# Patient Record
Sex: Female | Born: 1975 | Race: Black or African American | Hispanic: No | Marital: Married | Smoking: Never smoker
Health system: Southern US, Community
[De-identification: ages and names within clinical notes are randomized; demographics above are authoritative.]

## PROBLEM LIST (undated history)

## (undated) DIAGNOSIS — D219 Benign neoplasm of connective and other soft tissue, unspecified: Secondary | ICD-10-CM

## (undated) DIAGNOSIS — IMO0002 Reserved for concepts with insufficient information to code with codable children: Secondary | ICD-10-CM

## (undated) HISTORY — DX: Reserved for concepts with insufficient information to code with codable children: IMO0002

## (undated) HISTORY — DX: Benign neoplasm of connective and other soft tissue, unspecified: D21.9

## (undated) HISTORY — PX: CERVICAL CERCLAGE: SHX1329

---

## 2000-02-04 ENCOUNTER — Inpatient Hospital Stay (HOSPITAL_COMMUNITY): Admission: AD | Admit: 2000-02-04 | Discharge: 2000-02-07 | Payer: Self-pay | Admitting: *Deleted

## 2000-02-11 ENCOUNTER — Encounter (HOSPITAL_COMMUNITY): Admission: RE | Admit: 2000-02-11 | Discharge: 2000-05-11 | Payer: Self-pay | Admitting: *Deleted

## 2000-02-18 ENCOUNTER — Inpatient Hospital Stay (HOSPITAL_COMMUNITY): Admission: AD | Admit: 2000-02-18 | Discharge: 2000-02-25 | Payer: Self-pay | Admitting: *Deleted

## 2000-03-31 ENCOUNTER — Encounter: Payer: Self-pay | Admitting: *Deleted

## 2000-05-05 ENCOUNTER — Encounter: Payer: Self-pay | Admitting: Obstetrics & Gynecology

## 2000-05-12 ENCOUNTER — Encounter: Payer: Self-pay | Admitting: *Deleted

## 2000-05-12 ENCOUNTER — Encounter (HOSPITAL_COMMUNITY): Admission: RE | Admit: 2000-05-12 | Discharge: 2000-06-17 | Payer: Self-pay | Admitting: *Deleted

## 2000-06-09 ENCOUNTER — Encounter: Payer: Self-pay | Admitting: *Deleted

## 2000-06-16 ENCOUNTER — Encounter (INDEPENDENT_AMBULATORY_CARE_PROVIDER_SITE_OTHER): Payer: Self-pay

## 2000-06-16 ENCOUNTER — Inpatient Hospital Stay (HOSPITAL_COMMUNITY): Admission: AD | Admit: 2000-06-16 | Discharge: 2000-06-19 | Payer: Self-pay | Admitting: *Deleted

## 2000-06-23 ENCOUNTER — Inpatient Hospital Stay (HOSPITAL_COMMUNITY): Admission: AD | Admit: 2000-06-23 | Discharge: 2000-06-23 | Payer: Self-pay | Admitting: *Deleted

## 2000-07-28 ENCOUNTER — Encounter (INDEPENDENT_AMBULATORY_CARE_PROVIDER_SITE_OTHER): Payer: Self-pay

## 2000-07-28 ENCOUNTER — Inpatient Hospital Stay (HOSPITAL_COMMUNITY): Admission: AD | Admit: 2000-07-28 | Discharge: 2000-07-28 | Payer: Self-pay | Admitting: *Deleted

## 2004-09-18 ENCOUNTER — Ambulatory Visit: Payer: Self-pay | Admitting: *Deleted

## 2004-09-19 ENCOUNTER — Observation Stay (HOSPITAL_COMMUNITY): Admission: AD | Admit: 2004-09-19 | Discharge: 2004-09-20 | Payer: Self-pay | Admitting: *Deleted

## 2004-09-19 ENCOUNTER — Ambulatory Visit: Payer: Self-pay | Admitting: *Deleted

## 2004-09-25 ENCOUNTER — Inpatient Hospital Stay (HOSPITAL_COMMUNITY): Admission: AD | Admit: 2004-09-25 | Discharge: 2004-09-27 | Payer: Self-pay | Admitting: *Deleted

## 2004-10-10 ENCOUNTER — Ambulatory Visit: Payer: Self-pay | Admitting: Family Medicine

## 2004-10-24 ENCOUNTER — Ambulatory Visit: Payer: Self-pay | Admitting: Family Medicine

## 2004-10-25 ENCOUNTER — Ambulatory Visit (HOSPITAL_COMMUNITY): Admission: RE | Admit: 2004-10-25 | Discharge: 2004-10-25 | Payer: Self-pay | Admitting: *Deleted

## 2004-11-07 ENCOUNTER — Ambulatory Visit: Payer: Self-pay | Admitting: Family Medicine

## 2004-11-12 ENCOUNTER — Ambulatory Visit: Payer: Self-pay | Admitting: *Deleted

## 2004-11-14 ENCOUNTER — Ambulatory Visit: Payer: Self-pay | Admitting: Family Medicine

## 2004-11-27 ENCOUNTER — Ambulatory Visit: Payer: Self-pay | Admitting: *Deleted

## 2004-12-04 ENCOUNTER — Ambulatory Visit: Payer: Self-pay | Admitting: Family Medicine

## 2004-12-11 ENCOUNTER — Encounter: Admission: RE | Admit: 2004-12-11 | Discharge: 2004-12-11 | Payer: Self-pay | Admitting: Nephrology

## 2004-12-19 ENCOUNTER — Ambulatory Visit: Payer: Self-pay | Admitting: Family Medicine

## 2004-12-26 ENCOUNTER — Ambulatory Visit: Payer: Self-pay | Admitting: *Deleted

## 2005-01-02 ENCOUNTER — Ambulatory Visit: Payer: Self-pay | Admitting: Family Medicine

## 2005-01-09 ENCOUNTER — Ambulatory Visit: Payer: Self-pay | Admitting: Family Medicine

## 2005-01-23 ENCOUNTER — Ambulatory Visit: Payer: Self-pay | Admitting: Family Medicine

## 2005-02-06 ENCOUNTER — Ambulatory Visit: Payer: Self-pay | Admitting: Family Medicine

## 2005-02-20 ENCOUNTER — Ambulatory Visit: Payer: Self-pay | Admitting: Family Medicine

## 2005-02-27 ENCOUNTER — Ambulatory Visit: Payer: Self-pay | Admitting: Family Medicine

## 2005-03-06 ENCOUNTER — Ambulatory Visit: Payer: Self-pay | Admitting: Family Medicine

## 2005-03-13 ENCOUNTER — Ambulatory Visit: Payer: Self-pay | Admitting: Family Medicine

## 2005-03-15 ENCOUNTER — Encounter (INDEPENDENT_AMBULATORY_CARE_PROVIDER_SITE_OTHER): Payer: Self-pay | Admitting: Specialist

## 2005-03-15 ENCOUNTER — Inpatient Hospital Stay (HOSPITAL_COMMUNITY): Admission: AD | Admit: 2005-03-15 | Discharge: 2005-03-18 | Payer: Self-pay | Admitting: Family Medicine

## 2005-03-25 ENCOUNTER — Ambulatory Visit: Payer: Self-pay | Admitting: *Deleted

## 2005-04-01 ENCOUNTER — Ambulatory Visit: Payer: Self-pay | Admitting: Obstetrics and Gynecology

## 2009-09-12 ENCOUNTER — Ambulatory Visit: Payer: Self-pay | Admitting: Obstetrics and Gynecology

## 2010-09-08 ENCOUNTER — Encounter: Payer: Self-pay | Admitting: Nephrology

## 2010-09-18 HISTORY — PX: DILATION AND CURETTAGE OF UTERUS: SHX78

## 2011-01-03 NOTE — Discharge Summary (Signed)
NAMEIOLA, Karina Bray             ACCOUNT NO.:  [id number]   MEDICAL RECORD NO.:  [id number]          PATIENT TYPE:  INP   LOCATION:  9317                          [facility]ILITY[facility]  [facility]   PHYSICIAN:  Jon Gills, M.D.  DATE OF BIRTH:  11/23/1975   DATE OF ADMISSION:  09/25/2004  DATE OF DISCHARGE:  09/27/2004                                 DISCHARGE SUMMARY   ATTENDING PHYSICIAN:  Conni Elliot, M.D.   ADMISSION DIAGNOSES:  50.  A 35 year old G3, P1-1-1-1 at 15 weeks.  2.  History of cervical incompetence with a 21 week previable delivery and a      subsequent full-term delivery with cerclage.  3.  History of group beta Streptococcus with the last pregnancy.   DISCHARGE DIAGNOSES:  1.  A 15-week intrauterine pregnancy,  2.  Postoperative day #1, status post cerclage for cervical weakness.   DISCHARGE MEDICATIONS:  Prenatal vitamin one p.o. daily.   ADMISSION HISTORY:  Ms. Storr was admitted to the [facility] for IV  antibiotics and p.o. Motrin prior to receiving a cerclage.  The cerclage was  placed on hospital day #2.  Please see the operative note.  Her  postoperative course was uncomplicated.  She continued with Unasyn for a  complete day following her procedure.  She had no contractions, no loss of  fluid, and no vaginal bleeding.  She had good fetal movement.   CONDITION ON DISCHARGE:  The patient was discharged to home in stable  condition.  Her cervix was closed and thick with the cerclage in place.   INSTRUCTIONS GIVEN TO PATIENT:  The patient was told to [facility] and return with bleeding, contractions, or loss of  fluid.      LC/MEDQ  D:  09/27/2004  T:  09/28/2004  Job:  [id number]   cc:   [facility]

## 2011-01-03 NOTE — Op Note (Signed)
[facility]  Patient:    Karina Bray, Karina Bray                    MRN: [id number] Proc. Date: 02/05/00 Adm. Date:  [id number] Attending:  Michaelle Copas                           Operative Report  PREOPERATIVE DIAGNOSIS:       Decreased cervical resistance.  POSTOPERATIVE DIAGNOSIS:      Decreased cervical resistance.  OPERATION:                    Modified McDonald cerclage.  SURGEON:                      Conni Elliot, M.D.  ASSISTANT:  ANESTHESIA:                   Spinal.  ESTIMATED BLOOD LOSS:  DESCRIPTION OF PROCEDURE:     After placing the patient in the dorsal lithotomy  position, the perineum and vagina were prepped with multiple layers of Betadine  solution.  A weighted speculum was placed in the posterior vagina.  Anterior cervix was grasped with a spongestick.  A cervical cerclage using #4 silk double stranded was placed starting at 12 oclock in a counterclockwise fashion.  Estimated blood loss was less than 10 cc.  Sponge, needle, and instrument counts were correct. DD:  02/05/00 TD:  02/06/00 Job: [id number]

## 2011-01-03 NOTE — Discharge Summary (Signed)
[facility]  Patient:    Karina Bray, Karina Bray                    MRN: [id number] Adm. Date:  [id number] Disc. Date: [id number] Attending:  Michaelle Copas Dictator:   Creed Copper. Cornelius Moras, M.D.                           Discharge Summary  DISCHARGE DIAGNOSES:          1. Intrauterine pregnancy at 20-5/7 weeks.                               2. Status post cerclage placement.                               3. Group B strep positive.  DISCHARGE MEDICATIONS:        Unasyn 3 g IV q.6h. until Tuesday, February 11, 2000.  ACTIVITY:                     The patient is instructed to continue bed rest, to get up to eat and bathroom only.  Instructed not to have any sexual contact or anything that may make her have an orgasm.  FOLLOW-UP:                    In Dr. Gavin Potters clinic Tuesday, February 11, 2000, at 3 p.m.  HISTORY OF PRESENT ILLNESS:   The patient reports with contractions and cervical changes over the past few checks per Dr. Gavin Potters, with funneling noted by her primary M.D.  Referred to Dr. Gavin Potters for evaluation and placement of cerclage.  Admitted for placement of cerclage.  ADMISSION PHYSICAL EXAMINATION:         Temperature 98, blood pressure 121/70, pulse 96. Fetal heart tones ______ .  Negative accelerations, negative decelerations. On tocolysis there was uterine irritability with one contraction.  On cervical exam, cervix was 1 cm dilated, long, and high.  HOSPITAL COURSE: #1 - PRETERM DILATION OF CERVIX:  The patient was treated with antibiotics of Unasyn overnight prior to cerclage placement.  Placed on bed rest and Motrin. The patient tolerated placement of cerclage well.  Please see Dr. Gavin Potters operative note for details of this.  At the time of admission, cultures for group B strep and GC and chlamydia were taken.  During the course of hospitalization, group B strep was identified.  The patient was maintained on Unasyn pending the results of  this test and is sent home on Unasyn, given the results of these tests.  The patient had no uterine irritability or contractions although, on the morning of discharge, did report some increased pressure in her pelvis after standing in the shower for a prolonged period of time.  She agrees to limit her standing. DD:  02/07/00 TD:  02/10/00 Job: [id number]

## 2011-01-03 NOTE — Discharge Summary (Signed)
[facility]  Patient:    Karina Bray, Karina Bray                    MRN: [id number] Adm. Date:  [id number] Disc. Date: 06/19/00 Attending:  Michaelle Copas Dictator:   Jamey Reas, M.D.                           Discharge Summary  DATE OF BIRTH:                Dec 06, 1975  ADMITTING DIAGNOSES:          Prodromal labor, breech position, preeclampsia.  DISCHARGE DIAGNOSES:          Prodromal labor, breech position, preeclampsia.  CONSULTS:                     None.  PROCEDURE:                    Primary low transverse cesarean section via Pfannenstiel on June 16, 2000.  HOSPITAL COURSE:              Patient came in for a prenatal visit.  She is a 35 year old G3, P1-1-1-1.  Postoperative day #3 status post low transverse cesarean section for breech presentation as well as prodromal labor and preeclampsia.  Patient was maintained on magnesium sulfate post cesarean section x 48 hours.  She had routine postoperative course.  The magnesium was discontinued on hospital day #2.  She remained stable throughout the remainder of her hospital course.  She had a viable female infant weighing 7 pounds 7 ounces who remained stable throughout hospital course.  Postpartum hemoglobin and hematocrit are 10.9 and 31.7 done on June 16, 2000.  Patient is bottle feeding.  She will be discharged to home.  She will follow up with Dr. Welton Flakes, her primary M.D. in [place] in six weeks for a postpartum check.  She will follow up with Dr. Corky Sox in maternity admissions on June 23, 2000 at 10:30 for recheck of her blood pressures.  DISCHARGE INSTRUCTIONS:       Routine postoperative.  DISCHARGE MEDICATIONS:        1. Percocet 5/500 mg one to two p.o. q.4-6h.                                  p.r.n. severe pain #20.                               2. Motrin 600 mg one p.o. t.i.d. p.r.n. pain.                               3. Prenatal vitamins one  p.o. q.d. x 6 weeks.  DISPOSITION:                  Patient is discharged to home in stable condition.DD:  06/19/00 TD:  06/19/00 Job: [id number]

## 2011-01-03 NOTE — Op Note (Signed)
[facility]  Patient:    Karina Bray, Karina Bray                    MRN: [id number] Proc. Date: 06/16/00 Adm. Date:  [id number] Attending:  Michaelle Copas Dictator:   Jamey Reas, M.D.                           Operative Report  DATE OF BIRTH:                10/02/1975  PREOPERATIVE DIAGNOSES:       Breech, prodromal labor, preeclampsia, fetal tachycardia.  POSTOPERATIVE DIAGNOSES:      Breech, prodromal labor, preeclampsia, fetal tachycardia.  PROCEDURE:                    Primary low transverse cesarean section via Pfannenstiel.  SURGEON:                      Conni Elliot, M.D.  FIRST ASSIST:                 Jamey Reas, M.D.  ANESTHESIA:                   Spinal.  FINDINGS:                     Viable female.  Apgars 6 at one minute, 9 at five minutes weighing 7 pounds 7 ounces.  FLUIDS:                       Lactated Ringers 1000 cc.  ESTIMATED BLOOD LOSS:         500 cc.  URINE OUTPUT:                 50 cc and concentrated.  COMPLICATIONS:                None.  CONDITION:                    Stable.  PROCEDURE:                    The patient was taken to the operating room where spinal anesthesia was introduced.  She was then prepped and draped in a normal sterile fashion in the dorsal supine position with a leftward tilt.  A Pfannenstiel incision was then made with a scalpel and carried through to the underlying layer of fascia with a knife.  The fascia was incised in the midline and the excision extended laterally with Mayo scissors.  Superior aspect of the fascial incision was then grasped with Kocher clamps and elevated.  Underlying rectus muscles dissected off bluntly.  Attention was then turned to the inferior aspect of this incision which in a similar fashion was grasped, tented up with Kocher clamps, and the rectus muscle dissected off bluntly.  The rectus muscles were then separated in the  midline, the peritoneum identified, tented up, and entered sharply with Metzenbaum scissors.  The peritoneal incision was then extended superiorly and inferiorly with good visualization of the bladder.  Bladder blade was then reinserted and the vesicouterine peritoneum identified, grasped with pickups, and entered sharply with Metzenbaum scissors.  This incision was then extended laterally. A bladder flap created digitally.  The bladder blade was then reinserted and the lower uterine  segment incised in a transverse fashion with the scalpel. The uterine incision was then extended laterally by stretching.  The bladder blade was removed and the infants left lower extremity delivered.  The right lower extremity was then delivered.  The remainder of the body was delivered in normal fashion.  The nose and mouth were suctioned with the bulb suction, the cord clamped and cut.  The infant was handed off to the awaiting pediatricians.  Cord gasses were sent.  Cord pH was 7.18.  The placenta was then removed manually, the uterus exteriorized and cleared of all clots and debris.  The uterine incision was repaired with 1-0 Chromic in a running locked fashion.  A second layer of the same suture was then used to obtain excellent hemostasis.  The bladder flap was repaired with 2-0 Chromic ______ needle in a running stitch and the uterus returned to the abdomen.  The gutters were cleared of all clots and the peritoneum closed with 2-0 Chromic suture.  The fascia was reapproximated with 0 Vicryl in a running fashion. The skin was closed with staples.  Patient tolerated the procedure well. Sponge, lap, and needle counts were correct x 2.  Unasyn 3 g was given IV prior to the procedure.  The patient was taken to the recovery room in stable condition.  She will be transferred to the AICU where magnesium will be started for her preeclampsia. DD:  06/16/00 TD:  06/16/00 Job: [id number]

## 2011-01-03 NOTE — Op Note (Signed)
NAMEETHLEEN, LORMAND             ACCOUNT NO.:  [id number]   MEDICAL RECORD NO.:  [id number]          PATIENT TYPE:  [facility]   LOCATION:  [facility]                            [facility]ACILITY[facility]   PHYSICIAN:  Tanya S. Shawnie Pons, M.D.   DATE OF BIRTH:  Jun 04, 1976   DATE OF PROCEDURE:  03/15/2005  DATE OF DISCHARGE:  03/18/2005                                 OPERATIVE REPORT   PREOPERATIVE DIAGNOSES:  1.  Intrauterine pregnancy at 39-plus weeks.  2.  Previous cesarean section.  3.  Active labor.  4.  History of cerclage in this pregnancy, now removed.  5.  Proteinuria.   POSTOPERATIVE DIAGNOSES:  1.  Intrauterine pregnancy at 39-plus weeks.  2.  Previous cesarean section.  3.  Active labor.  4.  History of cerclage in this pregnancy, now removed.  5.  Proteinuria.  6.  Breech presentation.   PROCEDURE:  Repeat low transverse C-section.   SURGEON:  Shelbie Proctor. Shawnie Pons, M.D.   ASSISTANT:  Rolm Gala, M.D.   ANESTHESIA:  Spinal and local.   SPECIMENS:  Placenta to pathology.   ESTIMATED BLOOD LOSS:  1,000 cc.   FINDINGS:  Viable female infant in a breech presentation.  Meconium-stained  fluid.  Apgars 3 and 7.  pH 7.28.   REASON FOR PROCEDURE:  The patient is a 35 year old gravida 3 para 1-1-0-1  who had a history of a second trimester loss followed by a pregnancy with a  cerclage that went to term who is pregnant this time with a cerclage.  She  is followed in the high risk clinic and had a previous C-section.  The  patient desired a repeat C-section plus BTL.  However, she changed her mind  about BTL prior to the procedure.   PROCEDURE:  The patient was taken to the O.R., where spinal analgesia was  administered.  She was then prepped and draped in the usual sterile fashion,  and a Foley catheter was placed in the bladder.  When anesthesia was found  to be adequate, a knife was used to make a Pfannenstiel incision through an  old scar.  This was carried down to the underlying fascia,  which was incised  in the midline.  Two Kocher clamps were then used to elevate the superior  edge of the fascia off the underlying rectus, and this was taken down  sharply with Mayo scissors.  Peritoneal cavity was entered and then the  incision extended by the surgeon, pulling on either side of the incision.  Bladder blade was placed inside the cavity, and a low transverse incision  was made on the uterus.  This was carried down to the underlying amniotic  cavity, where thick meconium-stained fluid was noted.  It was possible that  the patient had been ruptured since three days prior.  However, this had not  been completely confirmed, but she had been complaining of leakage of fluid.  Infant was also noted to be a breech presentation.  The infant was moving  during the incision.  There was some difficulty in getting the breech out,  as the  infant's legs kept contracting about the surgeon's hand.  Finally,  the legs were delivered and the rest of the body delivered, arms and head  atraumatically.  The infant was noted to have poor tone and grasp x 2.  DeLee suction was then used on the abdomen and the cord clamped x 2 and  infant taken to awaiting pediatrics.  Cord pH and cord blood were obtained.  The placenta was delivered, the uterine cavity cleaned out with dry lap pad.  The uterus was closed with a 0 Vicryl suture in a locked-running fashion,  and a second imbricating layer was then used to complete hemostasis.  The  tubes and ovaries were felt and found to be normal.  However, omentum and  bowel were coming up through the incision, and the patient was very  uncomfortable with any type of manipulation of this.  At that point, the  fascia was closed with 0 Vicryl suture in a running fashion.  The  subcutaneous tissue was irrigated and any bleeders cauterized, and the skin  closed using clips.  The incision was infiltrated with 10 cc of 0.25%  Marcaine.  All instrument, needle, and lap  counts were correct x 2.  The  patient was awakened and taken to the recovery room in stable condition.       TSP/MEDQ  D:  03/15/2005  T:  03/16/2005  Job:  [id number]

## 2011-01-03 NOTE — Op Note (Signed)
Karina Bray, Karina Bray             ACCOUNT NO.:  [id number]   MEDICAL RECORD NO.:  [id number]          PATIENT TYPE:  INP   LOCATION:  9317                          [facility]   PHYSICIAN:  Conni Elliot, M.D.DATE OF BIRTH:  1976/02/05   DATE OF PROCEDURE:  09/26/2004  DATE OF DISCHARGE:  09/27/2004                                 OPERATIVE REPORT   PREOPERATIVE DIAGNOSIS:  Decreased cervical resistance at 15 weeks'  pregnancy.   POSTOPERATIVE DIAGNOSIS:  Decreased cervical resistance at 15 weeks'  pregnancy.   OPERATION:  Cervical cerclage.   OPERATOR:  Conni Elliot, M.D.   ANESTHESIA:  Spinal.   OPERATIVE PROCEDURE:  After placing the patient under spinal anesthetic, the  patient was in the supine then dorsal lithotomy position.  Perineum and  vagina were prepped with Betadine solution.  Foley catheter was placed to  straight drainage.  A weighted speculum was placed in the posterior vagina.  Clindamycin douche was placed.  The anterior cervix was grasped with a  sponge stick.  A cervical cerclage was placed using #4 silk double stranded  starting at 12 o'clock in a counterclockwise fashion.  Knot was tied at 12  o'clock.  Clindamycin douche was reapplied.  Needle and sponge count were  correct.      ASG/MEDQ  D:  10/17/2004  T:  10/17/2004  Job:  [id number]

## 2011-01-03 NOTE — Discharge Summary (Signed)
[facility]  Patient:    Karina Bray, Karina Bray                    MRN: [id number] Adm. Date:  [id number] Disc. Date: 02/25/00 Attending:  Michaelle Copas                           Discharge Summary  DATE OF BIRTH:                April 08, 1976  DISCHARGE DIAGNOSIS:          Preterm labor.  PROCEDURES:                   Pelvic ultrasound.  PRESENTING HISTORY:           This is a 35 year old G3, P0-1-1-0 [facility] 22 weeks 3 days of an intrauterine pregnancy who was seen for a prenatal visit and was found to have uterine contractions on ______.  Patients obstetrical history was significant for a cerclage with this pregnancy due to a spontaneous abortion at 21 weeks in the year 2000 and another spontaneous abortion in 1998.  On presentation patient was also found to be group B strep positive and have a hemoglobin and hematocrit of 11.5 and 33.3 respectively.  Therefore, patients obstetrical history and uterine contractions had been picked up on the monitor.  Patient was admitted at the antenatal unit for preterm labor and was begun on IV Unasyn and IV fluids plus monitoring.  Patient did well over the course of her stay without significant uterine activity and was discharged to home on Procardia 3 q.d. and clindamycin douches plus amoxicillin p.o. q.d.  DISPOSITION:                  Patient was discharged home in good condition.  FOLLOW-UP:                    Patient was to follow up on March 03, 2000 at the high risk clinic at 3 p.m. DD:  05/24/00 TD:  05/25/00 Job: [id number]

## 2011-01-03 NOTE — Discharge Summary (Signed)
NAMEALDEA, Karina Bray             ACCOUNT NO.:  [id number]   MEDICAL RECORD NO.:  [id number]          PATIENT TYPE:  INP   LOCATION:  [facility]                            FACILITY:  [facility]   PHYSICIAN:  Tanya S. Shawnie Pons, M.D.   DATE OF BIRTH:  15-Nov-1975   DATE OF ADMISSION:  03/13/2005  DATE OF DISCHARGE:  03/18/2005                                 DISCHARGE SUMMARY   ADMITTING DIAGNOSIS:  Previous cesarean section.   DISCHARGE DIAGNOSIS:  Repeat cesarean section.   CONSULTS:  None.   PERTINENT LABORATORIES:  Hemoglobin 10.3, hematocrit 30.4, white blood count  8.8.  RPR nonreactive.  Rubella immune.  GBS unknown.   PROCEDURES:  Lower transverse repeat cesarean section.   HOSPITAL COURSE:  This is a 35 year old G3, P1-2-0-2 who presented to the  [facility] laboring and with questionable ruptured membranes and  underwent a lower transverse cesarean section at 39 and 5 days.  Baby was in  the NICU and doing well now.  Patient tolerated procedure well and was  afebrile the day of discharge.  Baby's Apgars were 3 and 7.  Estimated blood  loss 700 mL.  Mom is bottle feeding, to get IUD.  Blood type B+.  Rubella  immune.  Mom was discharged home on day number 3 and baby remained in the  NICU.   DISPOSITION:  Patient home in good condition.   DIET:  Regular.   ACTIVITY:  Nothing per vagina for six weeks.   __________ Percocet 5/325 p.o. q.4h. p.r.n. pain, ibuprofen __________ p.o.  q.6h. p.r.n. cramps, prenatal vitamins.   DISCHARGE INSTRUCTIONS:  Per pink sheet.  Patient is to follow up at [facility]  __________ five days for suture removal and she will follow up in four weeks  for a postpartum check.      Krist   KS/MEDQ  D:  03/18/2005  T:  03/18/2005  Job:  [id number]

## 2011-01-03 NOTE — Op Note (Signed)
[name], [name]             ACCOUNT NO.:  [id number]   MEDICAL RECORD NO.:  [id number]          PATIENT TYPE:  INP   LOCATION:  9317                          [facility]ILITY:  [facility]   PHYSICIAN:  Conni Elliot, M.D.DATE OF BIRTH:  1976-07-13   DATE OF PROCEDURE:  09/26/2004  DATE OF DISCHARGE:                                 OPERATIVE REPORT   PREOPERATIVE DIAGNOSIS:  Weak cervix at 15 weeks pregnancy.   POSTOPERATIVE DIAGNOSIS:  Weak cervix at 15 weeks pregnancy.   PROCEDURE:  [employer] cerclage.   SURGEON:  Conni Elliot, M.D.   ANESTHESIA:  Spinal.   DESCRIPTION OF PROCEDURE:  After placing the patient under spinal anesthesia  with the patient supine in the dorsal lithotomy position, the perineum and  vagina were prepped and draped with Betadine solution.  A weighted speculum  was placed in the posterior vagina and a Foley catheter was placed to  straight drainage.  The anterior cervix was grasped with a spongestick and  cervical cerclage was placed starting at 12 o'clock using 4-0 double  stranded silk.  The cerclage was placed in a counter clockwise fashion, tied  at 12 o'clock.  Estimated blood loss was less than 50 mL.  Needle, sponge,  and instrument counts correct.      ASG/MEDQ  D:  09/26/2004  T:  09/26/2004  Job:  [id number]

## 2011-11-24 ENCOUNTER — Encounter: Payer: Self-pay | Admitting: Obstetrics & Gynecology

## 2011-11-25 DIAGNOSIS — Z3490 Encounter for supervision of normal pregnancy, unspecified, unspecified trimester: Secondary | ICD-10-CM

## 2011-11-25 DIAGNOSIS — O0990 Supervision of high risk pregnancy, unspecified, unspecified trimester: Secondary | ICD-10-CM | POA: Insufficient documentation

## 2011-11-28 LAB — OB RESULTS CONSOLE PLATELET COUNT: Platelets: 194 10*3/uL

## 2011-11-28 LAB — OB RESULTS CONSOLE HIV ANTIBODY (ROUTINE TESTING): HIV: NONREACTIVE

## 2011-11-28 LAB — CULTURE, OB URINE

## 2011-11-28 LAB — OB RESULTS CONSOLE HEPATITIS B SURFACE ANTIGEN: Hepatitis B Surface Ag: NEGATIVE

## 2011-11-28 LAB — CYTOLOGY - PAP: Pap: NEGATIVE

## 2011-11-28 LAB — OB RESULTS CONSOLE RUBELLA ANTIBODY, IGM: Rubella: IMMUNE

## 2011-11-28 LAB — OB RESULTS CONSOLE ABO/RH: RH Type: POSITIVE

## 2011-11-28 LAB — OB RESULTS CONSOLE GC/CHLAMYDIA: Chlamydia: NEGATIVE

## 2011-12-03 ENCOUNTER — Encounter: Payer: Self-pay | Admitting: Advanced Practice Midwife

## 2011-12-18 DIAGNOSIS — N883 Incompetence of cervix uteri: Secondary | ICD-10-CM | POA: Insufficient documentation

## 2011-12-25 ENCOUNTER — Ambulatory Visit (INDEPENDENT_AMBULATORY_CARE_PROVIDER_SITE_OTHER): Payer: BC Managed Care – PPO | Admitting: Obstetrics and Gynecology

## 2011-12-25 ENCOUNTER — Encounter: Payer: Self-pay | Admitting: Obstetrics and Gynecology

## 2011-12-25 VITALS — BP 125/83 | Temp 98.6°F | Wt 202.4 lb

## 2011-12-25 DIAGNOSIS — O3430 Maternal care for cervical incompetence, unspecified trimester: Secondary | ICD-10-CM

## 2011-12-25 DIAGNOSIS — O09529 Supervision of elderly multigravida, unspecified trimester: Secondary | ICD-10-CM

## 2011-12-25 DIAGNOSIS — Z9889 Other specified postprocedural states: Secondary | ICD-10-CM

## 2011-12-25 DIAGNOSIS — Z98891 History of uterine scar from previous surgery: Secondary | ICD-10-CM | POA: Insufficient documentation

## 2011-12-25 DIAGNOSIS — N883 Incompetence of cervix uteri: Secondary | ICD-10-CM

## 2011-12-25 LAB — POCT URINALYSIS DIP (DEVICE)
Glucose, UA: NEGATIVE mg/dL
Protein, ur: 30 mg/dL — AB
Specific Gravity, Urine: >=1.03 (ref 1.005–1.030)
pH: 6 (ref 5.0–8.0)

## 2011-12-25 NOTE — Progress Notes (Signed)
Transferred from [facility], came to [facility] previous preg. . Cerclage in place. RLP and discomforts discussed. Had [place] normal per pt at 16 wks. Discussed rpt C/S and BTL. Discussed early glucola > will come back for that. Records reviewed.

## 2011-12-25 NOTE — Progress Notes (Signed)
Pulse- 110  Pain-"sharp pain in lower abd" Education booklet, tdap info given.  Pt declines WIC info.  Declines flu vaccine

## 2011-12-25 NOTE — Patient Instructions (Signed)

## 2012-01-01 ENCOUNTER — Other Ambulatory Visit: Payer: BC Managed Care – PPO

## 2012-01-01 DIAGNOSIS — Z8632 Personal history of gestational diabetes: Secondary | ICD-10-CM

## 2012-01-02 ENCOUNTER — Encounter: Payer: Self-pay | Admitting: Obstetrics & Gynecology

## 2012-01-15 ENCOUNTER — Ambulatory Visit (INDEPENDENT_AMBULATORY_CARE_PROVIDER_SITE_OTHER): Payer: BC Managed Care – PPO | Admitting: Family Medicine

## 2012-01-15 VITALS — BP 130/83 | Temp 97.4°F | Wt 202.1 lb

## 2012-01-15 DIAGNOSIS — R7309 Other abnormal glucose: Secondary | ICD-10-CM

## 2012-01-15 DIAGNOSIS — O34219 Maternal care for unspecified type scar from previous cesarean delivery: Secondary | ICD-10-CM

## 2012-01-15 DIAGNOSIS — N883 Incompetence of cervix uteri: Secondary | ICD-10-CM

## 2012-01-15 DIAGNOSIS — Z98891 History of uterine scar from previous surgery: Secondary | ICD-10-CM

## 2012-01-15 DIAGNOSIS — O99810 Abnormal glucose complicating pregnancy: Secondary | ICD-10-CM

## 2012-01-15 LAB — POCT URINALYSIS DIP (DEVICE)
Bilirubin Urine: NEGATIVE
Glucose, UA: NEGATIVE mg/dL
Ketones, ur: NEGATIVE mg/dL
Specific Gravity, Urine: >=1.03 (ref 1.005–1.030)
Urobilinogen, UA: 0.2 mg/dL (ref 0.0–1.0)

## 2012-01-15 NOTE — Patient Instructions (Signed)
Cervical Insufficiency Cervical insufficiency (CI) is when the cervix is not strong enough to keep a baby (fetus) inside the womb (uterus). It occurs in the 2nd and early 3rd trimesters of pregnancy. The cervix will enlarge (dilate) on its own without contractions. When this happens, the membranes around the fetus will often balloon down into the birth canal (vagina). The membranes may break, which could end the pregnancy (miscarriage). CAUSES  The cause of a cervical insufficiency is often not known. Possible causes include:  Injury to the cervix from a past pregnancy.   Injury to the cervix from past surgeries.   Being born with this defect of the cervix.   Cold cone, laser or LEEP (Loop electrocautery excision procedure) to the cervix.   Over dilating the cervix during an abortion.   Being exposed to DES (diethylstilbestrol) during pregnancy.   Lack of tissue (elastin and collagen) in the cervix that holds the baby in uterus.   Shorter cervix than normal.  SYMPTOMS   Spotting or bleeding from the vagina.   Feeling pressure in the vagina.   Unusual or abnormal vaginal discharge.  DIAGNOSIS   In many cases, the diagnosis is not made until after the pregnancy is lost.   Often this diagnosis will be made by exam.   Sometimes, an ultrasound of the cervix may be helpful. The ultrasound measures and follows the length of the cervix in women who are at risk of having CI.   Your caregiver may follow the dilatation of the cervix. Often, the diagnosis cannot be made until it happens. When this is the case, there is a much greater chance of early loss of the pregnancy. This means the baby is born too early to survive outside of the mother.  PREVENTION   A high risk patient needs to get frequent vaginal exams and serial ultrasounds.   Tie a suture, like a purse string, around the cervix (cerclage), before getting pregnant.  TREATMENT  When CI is diagnosed early, the treatment is a  cerclage. This gives the cervix added support. The cerclage helps carry the baby to term. This is usually done before the first trimester (12 to 14 weeks). Cerclage is usually not done after the second trimester (24 weeks) unless it is an emergency. Your caregiver can discuss the risks of this procedure. The cerclage suture may be removed when labor begins or at term before labor begins. The suture can also be left in place for future pregnancies. If left in place, the baby is delivered by Cesarean section. HOME CARE INSTRUCTIONS   Keep your follow-up prenatal appointments.   Take medication as directed by your caregiver.   Avoid physical activities, exercise and sexual intercourse until you have permission from your caregiver.   Do not douche or use tampons.   Resume your usual diet.  SEEK MEDICAL CARE IF:  You develop abnormal vaginal discharge. SEEK IMMEDIATE MEDICAL CARE IF:   You have a fever.   You develop uterine contractions.   You do not feel the baby moving or the baby is not moving as much as usual.   You pass out.   You have vaginal bleeding.   You are leaking fluid or have a gush of fluid from your vagina.   You have blood in your urine or pain when urinating.  Document Released: 08/04/2005 Document Revised: 07/24/2011 Document Reviewed: 11/22/2008 ExitCare Patient Information 2012 [employer]. 

## 2012-01-15 NOTE — Progress Notes (Signed)
Having some mild cramping for an hour at work - went home and cramps went away.  No other concerns.  No vaginal bleeding, decreased fetal activity, vaginal discharge.

## 2012-01-15 NOTE — Progress Notes (Signed)
Pulse- 111  Pain- "cramping"

## 2012-01-23 ENCOUNTER — Other Ambulatory Visit: Payer: BC Managed Care – PPO

## 2012-01-23 DIAGNOSIS — R7309 Other abnormal glucose: Secondary | ICD-10-CM

## 2012-01-23 DIAGNOSIS — O24419 Gestational diabetes mellitus in pregnancy, unspecified control: Secondary | ICD-10-CM

## 2012-01-23 LAB — GLUCOSE TOLERANCE, 3 HOURS
Glucose Tolerance, 1 hour: 173 mg/dL (ref 70–189)
Glucose, GTT - 3 Hour: 147 mg/dL — ABNORMAL HIGH (ref 70–144)

## 2012-01-26 ENCOUNTER — Telehealth: Payer: Self-pay

## 2012-01-26 DIAGNOSIS — O24419 Gestational diabetes mellitus in pregnancy, unspecified control: Secondary | ICD-10-CM | POA: Insufficient documentation

## 2012-01-26 NOTE — Telephone Encounter (Signed)
Called pt and left message to return our call to the clinics its concerning about an appt.

## 2012-01-26 NOTE — Telephone Encounter (Signed)
Message copied by Faythe Casa on Mon Jan 26, 2012 11:42 AM ------      Message from: CONSTANT, Gigi Gin      Created: Mon Jan 26, 2012  9:08 AM      Regarding: Patient with GDM       This patient failed 3 hr GTT and needs to be seen by Seward Grater and then start in [facility]            Peggy      ----- Message -----         From: [facility]         Sent: 01/23/2012   6:22 PM           To: Catalina Antigua, MD

## 2012-01-28 NOTE — Telephone Encounter (Signed)
Called pt and left message to return our call to the clinics.  

## 2012-01-29 ENCOUNTER — Ambulatory Visit (INDEPENDENT_AMBULATORY_CARE_PROVIDER_SITE_OTHER): Payer: BC Managed Care – PPO | Admitting: Obstetrics and Gynecology

## 2012-01-29 VITALS — BP 136/84 | Temp 97.5°F | Wt 200.8 lb

## 2012-01-29 DIAGNOSIS — O99810 Abnormal glucose complicating pregnancy: Secondary | ICD-10-CM

## 2012-01-29 DIAGNOSIS — N883 Incompetence of cervix uteri: Secondary | ICD-10-CM

## 2012-01-29 DIAGNOSIS — O24419 Gestational diabetes mellitus in pregnancy, unspecified control: Secondary | ICD-10-CM

## 2012-01-29 DIAGNOSIS — O0990 Supervision of high risk pregnancy, unspecified, unspecified trimester: Secondary | ICD-10-CM

## 2012-01-29 DIAGNOSIS — O34219 Maternal care for unspecified type scar from previous cesarean delivery: Secondary | ICD-10-CM

## 2012-01-29 DIAGNOSIS — Z98891 History of uterine scar from previous surgery: Secondary | ICD-10-CM

## 2012-01-29 DIAGNOSIS — O09529 Supervision of elderly multigravida, unspecified trimester: Secondary | ICD-10-CM

## 2012-01-29 DIAGNOSIS — R7309 Other abnormal glucose: Secondary | ICD-10-CM

## 2012-01-29 LAB — POCT URINALYSIS DIP (DEVICE)
Bilirubin Urine: NEGATIVE
Glucose, UA: NEGATIVE mg/dL
Hgb urine dipstick: NEGATIVE
Leukocytes, UA: NEGATIVE
Nitrite: NEGATIVE
Urobilinogen, UA: 0.2 mg/dL (ref 0.0–1.0)

## 2012-01-29 NOTE — Progress Notes (Signed)
Pulse: 116 

## 2012-01-29 NOTE — Telephone Encounter (Signed)
Patient has an appointment in clinic today.

## 2012-01-29 NOTE — Progress Notes (Signed)
Nutrition Note: (1st consult, referral for newly dx GDM0 Pt is a newly dx GDM with hx of PTD and obesity. Pt has overall wt gain of 4# @ [redacted]w[redacted]d gestation, plots about 2#< expected. Pt works 3rd shift, so eating times are opposite typical times.   Pt reports no food allergies and no N/V.  Does report eating a lot of starches and drinking a lot of sweet tea. Pt give verbal and written GDM diet education.  Disc importance of 3 meals and 3 snacks, with serving sizes and CHO/protein intake.  Pt very understanding and willing to make necessary changes.   Pt will return Monday to get meter teaching from [facility] May, CDE.  Referred to [facility] for appointment.  Follow up in 2-4 weeks.  Cy Blamer, RD

## 2012-01-29 NOTE — Progress Notes (Signed)
Patient doing well without complaints. Patient informed of diagnosis of gestational diabetes and importance of adhering to diet and monitoring CBG. Patient to meet with nutritionist today and will meet with maggie on Monday. FM/PTL precautions reviewed

## 2012-01-29 NOTE — Telephone Encounter (Signed)
Patient in clinic today, notified Dr. Jolayne Panther patient needs to be counseled re: new dx GDM and see Nutrition today for diet training.

## 2012-02-02 ENCOUNTER — Encounter: Payer: BC Managed Care – PPO | Attending: Family Medicine | Admitting: Dietician

## 2012-02-02 ENCOUNTER — Other Ambulatory Visit: Payer: Self-pay | Admitting: Obstetrics and Gynecology

## 2012-02-02 ENCOUNTER — Telehealth: Payer: Self-pay | Admitting: General Practice

## 2012-02-02 ENCOUNTER — Other Ambulatory Visit: Payer: BC Managed Care – PPO

## 2012-02-02 DIAGNOSIS — Z713 Dietary counseling and surveillance: Secondary | ICD-10-CM | POA: Insufficient documentation

## 2012-02-02 DIAGNOSIS — O99810 Abnormal glucose complicating pregnancy: Secondary | ICD-10-CM | POA: Insufficient documentation

## 2012-02-02 MED ORDER — ACCU-CHEK FASTCLIX LANCETS MISC: 1.0000 [IU] | Freq: Four times a day (QID) | Status: DC

## 2012-02-02 MED ORDER — GLUCOSE BLOOD VI STRP: ORAL_STRIP | Status: AC

## 2012-02-02 NOTE — Progress Notes (Unsigned)
Diabetes Education Completed meter education.  Provided an Accu Chek SmartView meter kit ([place] [employer])  ZOX:[id number] EXP:02/14/2013.  Completed return demonstration and glucose 35 minutes after breakfast was 135 mg.  Instructed to monitor fasting and 2 hr after the first bite of each meal.  Fasting goal= 60-90 and 2 hr pp level of 120 mg or less.  Instructed to bring meter and glucose log to all clinic appointments.  Significant other and children present.   Maggie Aser Nylund, Rn, [place], CDE

## 2012-02-02 NOTE — Telephone Encounter (Signed)
Patient called wanting to know "is it safe to lift over head at [redacted] weeks pregnant?"

## 2012-02-02 NOTE — Telephone Encounter (Signed)
Called pt and left message that we are returning the call to please return our call to clinics.

## 2012-02-04 NOTE — Telephone Encounter (Signed)
Called pt and left message that she should not lift anything over 20 lbs. Also, she should not lift anything directly over her head as that may cause her to become off-balance and fall. She may call back if she has further questions.

## 2012-02-09 ENCOUNTER — Ambulatory Visit (INDEPENDENT_AMBULATORY_CARE_PROVIDER_SITE_OTHER): Payer: BC Managed Care – PPO | Admitting: Obstetrics & Gynecology

## 2012-02-09 VITALS — BP 134/83 | Temp 97.5°F | Wt 200.3 lb

## 2012-02-09 DIAGNOSIS — N883 Incompetence of cervix uteri: Secondary | ICD-10-CM

## 2012-02-09 DIAGNOSIS — O0990 Supervision of high risk pregnancy, unspecified, unspecified trimester: Secondary | ICD-10-CM

## 2012-02-09 DIAGNOSIS — Z98891 History of uterine scar from previous surgery: Secondary | ICD-10-CM

## 2012-02-09 DIAGNOSIS — O24419 Gestational diabetes mellitus in pregnancy, unspecified control: Secondary | ICD-10-CM

## 2012-02-09 DIAGNOSIS — O09529 Supervision of elderly multigravida, unspecified trimester: Secondary | ICD-10-CM

## 2012-02-09 DIAGNOSIS — O99810 Abnormal glucose complicating pregnancy: Secondary | ICD-10-CM

## 2012-02-09 DIAGNOSIS — O34219 Maternal care for unspecified type scar from previous cesarean delivery: Secondary | ICD-10-CM

## 2012-02-09 LAB — CBC
HCT: 31.7 % — ABNORMAL LOW (ref 36.0–46.0)
Hemoglobin: 11.3 g/dL — ABNORMAL LOW (ref 12.0–15.0)
MCH: 27.1 pg (ref 26.0–34.0)
MCV: 76 fL — ABNORMAL LOW (ref 78.0–100.0)
RBC: 4.17 MIL/uL (ref 3.87–5.11)
WBC: 4.6 10*3/uL (ref 4.0–10.5)

## 2012-02-09 LAB — POCT URINALYSIS DIP (DEVICE)
Bilirubin Urine: NEGATIVE
Glucose, UA: NEGATIVE mg/dL
Leukocytes, UA: NEGATIVE
Protein, ur: NEGATIVE mg/dL
Specific Gravity, Urine: >=1.03 (ref 1.005–1.030)

## 2012-02-09 NOTE — Progress Notes (Signed)
Pulse- 97  Pressure- "when baby balls up" stomach, lower abd

## 2012-02-09 NOTE — Progress Notes (Signed)
U/S scheduled at [facility] February 16, 2012 at 11 am.

## 2012-02-09 NOTE — Progress Notes (Signed)
Blood sugars are within range, only one abnormal postprandial. Works third shift, only a few values recorded.  Recommended checking more values, will also check a HgbA1C today with her third trimester labs. Will also check ultrasound given size> dates, obesity, GDM.  Email sent to surgical scheduler to schedule RCS and BTL at 39 weeks.  No other complaints or concerns.  Fetal movement and labor precautions reviewed.

## 2012-02-09 NOTE — Patient Instructions (Signed)
Return to clinic for any obstetric concerns or go to [facility] for evaluation  

## 2012-02-12 ENCOUNTER — Encounter: Payer: BC Managed Care – PPO | Admitting: Family

## 2012-02-16 ENCOUNTER — Other Ambulatory Visit: Payer: Self-pay | Admitting: Obstetrics & Gynecology

## 2012-02-16 ENCOUNTER — Ambulatory Visit (HOSPITAL_COMMUNITY)
Admission: RE | Admit: 2012-02-16 | Discharge: 2012-02-16 | Disposition: A | Discharge status: Home / Self Care | Payer: BC Managed Care – PPO | Source: Ambulatory Visit | Attending: Obstetrics & Gynecology | Admitting: Obstetrics & Gynecology

## 2012-02-16 VITALS — BP 126/72 | HR 97 | Wt 201.5 lb

## 2012-02-16 DIAGNOSIS — Z8751 Personal history of pre-term labor: Secondary | ICD-10-CM | POA: Insufficient documentation

## 2012-02-16 DIAGNOSIS — O0990 Supervision of high risk pregnancy, unspecified, unspecified trimester: Secondary | ICD-10-CM

## 2012-02-16 DIAGNOSIS — O09299 Supervision of pregnancy with other poor reproductive or obstetric history, unspecified trimester: Secondary | ICD-10-CM | POA: Insufficient documentation

## 2012-02-16 DIAGNOSIS — O09529 Supervision of elderly multigravida, unspecified trimester: Secondary | ICD-10-CM

## 2012-02-16 DIAGNOSIS — O24419 Gestational diabetes mellitus in pregnancy, unspecified control: Secondary | ICD-10-CM

## 2012-02-16 DIAGNOSIS — O34219 Maternal care for unspecified type scar from previous cesarean delivery: Secondary | ICD-10-CM | POA: Insufficient documentation

## 2012-02-16 DIAGNOSIS — O99810 Abnormal glucose complicating pregnancy: Secondary | ICD-10-CM | POA: Insufficient documentation

## 2012-02-16 DIAGNOSIS — Z98891 History of uterine scar from previous surgery: Secondary | ICD-10-CM

## 2012-02-16 NOTE — Progress Notes (Signed)
Patient seen today  for ultrasound.  See full report in [facility].  Single IUP at 28 0/7 weeks Normal anatomic fetal survey. Somewhat limited views of the fetal spine were obtained due to fetal position Normal amniotic fluid volume A cervical length of 1.7 cm was noted. The cerclage stitch appears intact  Recommend follow up ultrasound in 4 weeks to reevaluate fetal anatomy and growth.  Alpha Gula, MD

## 2012-02-23 ENCOUNTER — Ambulatory Visit (INDEPENDENT_AMBULATORY_CARE_PROVIDER_SITE_OTHER): Payer: BC Managed Care – PPO | Admitting: Physician Assistant

## 2012-02-23 ENCOUNTER — Encounter: Payer: BC Managed Care – PPO | Attending: Family Medicine | Admitting: Dietician

## 2012-02-23 VITALS — BP 129/77 | Temp 98.4°F | Wt 201.4 lb

## 2012-02-23 DIAGNOSIS — O24419 Gestational diabetes mellitus in pregnancy, unspecified control: Secondary | ICD-10-CM

## 2012-02-23 DIAGNOSIS — O0990 Supervision of high risk pregnancy, unspecified, unspecified trimester: Secondary | ICD-10-CM

## 2012-02-23 DIAGNOSIS — Z713 Dietary counseling and surveillance: Secondary | ICD-10-CM | POA: Insufficient documentation

## 2012-02-23 DIAGNOSIS — O99810 Abnormal glucose complicating pregnancy: Secondary | ICD-10-CM | POA: Insufficient documentation

## 2012-02-23 LAB — POCT URINALYSIS DIP (DEVICE)
Bilirubin Urine: NEGATIVE
Glucose, UA: NEGATIVE mg/dL
Hgb urine dipstick: NEGATIVE
Nitrite: NEGATIVE
Protein, ur: NEGATIVE mg/dL
Specific Gravity, Urine: 1.025 (ref 1.005–1.030)
Urobilinogen, UA: 0.2 mg/dL (ref 0.0–1.0)

## 2012-02-23 NOTE — Patient Instructions (Signed)
Gestational Diabetes Mellitus Gestational diabetes mellitus (GDM) is diabetes that occurs only during pregnancy. This happens when the body cannot properly handle the glucose (sugar) that increases in the blood after eating. During pregnancy, insulin resistance (reduced sensitivity to insulin) occurs because of the release of hormones from the placenta. Usually, the pancreas of pregnant women produces enough insulin to overcome the resistance that occurs. However, in gestational diabetes, the insulin is there but it does not work effectively. If the resistance is severe enough that the pancreas does not produce enough insulin, extra glucose builds up in the blood.  WHO IS AT RISK FOR DEVELOPING GESTATIONAL DIABETES?  Women with a history of diabetes in the family.   Women over age 25.   Women who are overweight.   Women in certain ethnic groups (Hispanic, African American, Native American, Asian and Pacific Islander).  WHAT CAN HAPPEN TO THE BABY? If the mother's blood glucose is too high while she is pregnant, the extra sugar will travel through the umbilical cord to the baby. Some of the problems the baby may have are:  Large Baby - If the baby receives too much sugar, the baby will gain more weight. This may cause the baby to be too large to be born normally (vaginally) and a Cesarean section (C-section) may be needed.   Low Blood Glucose (hypoglycemia) - The baby makes extra insulin, in response to the extra sugar its gets from its mother. When the baby is born and no longer needs this extra insulin, the baby's blood glucose level may drop.   Jaundice (yellow coloring of the skin and eyes) - This is fairly common in babies. It is caused from a build-up of the chemical called bilirubin. This is rarely serious, but is seen more often in babies whose mothers had gestational diabetes.  RISKS TO THE MOTHER Women who have had gestational diabetes may be at higher risk for some problems,  including:  Preeclampsia or toxemia, which includes problems with high blood pressure. Blood pressure and protein levels in the urine must be checked frequently.   Infections.   Cesarean section (C-section) for delivery.   Developing Type 2 diabetes later in life. About 30-50% will develop diabetes later, especially if obese.  DIAGNOSIS  The hormones that cause insulin resistance are highest at about 24-28 weeks of pregnancy. If symptoms are experienced, they are much like symptoms you would normally expect during pregnancy.  GDM is often diagnosed using a two part method: 1. After 24-28 weeks of pregnancy, the woman drinks a glucose solution and takes a blood test. If the glucose level is high, a second test will be given.  2. Oral Glucose Tolerance Test (OGTT) which is 3 hours long - After not eating overnight, the blood glucose is checked. The woman drinks a glucose solution, and hourly blood glucose tests are taken.  If the woman has risk factors for GDM, the caregiver may test earlier than 24 weeks of pregnancy. TREATMENT  Treatment of GDM is directed at keeping the mother's blood glucose level normal, and may include:  Meal planning.   Taking insulin or other medicine to control your blood glucose level.   Exercise.   Keeping a daily record of the foods you eat.   Blood glucose monitoring and keeping a record of your blood glucose levels.   May monitor ketone levels in the urine, although this is no longer considered necessary in most pregnancies.  HOME CARE INSTRUCTIONS  While you are pregnant:    Follow your caregiver's advice regarding your prenatal appointments, meal planning, exercise, medicines, vitamins, blood and other tests, and physical activities.   Keep a record of your meals, blood glucose tests, and the amount of insulin you are taking (if any). Show this to your caregiver at every prenatal visit.   If you have GDM, you may have problems with hypoglycemia (low  blood glucose). You may suspect this if you become suddenly dizzy, feel shaky, and/or weak. If you think this is happening and you have a glucose meter, try to test your blood glucose level. Follow your caregiver's advice for when and how to treat your low blood glucose. Generally, the 15:15 rule is followed: Treat by consuming 15 grams of carbohydrates, wait 15 minutes, and recheck blood glucose. Examples of 15 grams of carbohydrates are:   1 cup skim or low-fat milk.    cup juice.   3-4 glucose tablets.   5-6 hard candies.   1 small box raisins.    cup regular soda pop.   Practice good hygiene, to avoid infections.   Do not smoke.  SEEK MEDICAL CARE IF:   You develop abnormal vaginal discharge, with or without itching.   You become weak and tired more than expected.   You seem to sweat a lot.   You have a sudden increase in weight, 5 pounds or more in one week.   You are losing weight, 3 pounds or more in a week.   Your blood glucose level is high, and you need instructions on what to do about it.  SEEK IMMEDIATE MEDICAL CARE IF:   You develop a severe headache.   You faint or pass out.   You develop nausea and vomiting.   You become disoriented or confused.   You have a convulsion.   You develop vision problems.   You develop stomach pain.   You develop vaginal bleeding.   You develop uterine contractions.   You have leaking or a gush of fluid from the vagina.  AFTER YOU HAVE THE BABY:  Go to all of your follow-up appointments, and have blood tests as advised by your caregiver.   Maintain a healthy lifestyle, to prevent diabetes in the future. This includes:   Following a healthy meal plan.   Controlling your weight.   Getting enough exercise and proper rest.   Do not smoke.   Breastfeed your baby if you can. This will lower the chance of you and your baby developing diabetes later in life.  For more information about diabetes, go to the American  Diabetes Association at: www.americandiabetesassociation.org. For more information about gestational diabetes, go to the American Congress of Obstetricians and Gynecologists at: www.acog.org. Document Released: 11/10/2000 Document Revised: 07/24/2011 Document Reviewed: 06/04/2009 ExitCare Patient Information 2012 [employer]. 

## 2012-02-23 NOTE — Progress Notes (Signed)
Diabetes Education:  Review of the diet/carb intake/food label reading/carb counting in relation to a night time work schedule.  Review of the carb for meals and how to space meals and snacks with checking blood glucose. Will follow-up at next clinic visit.  Maggie Shahram Alexopoulos, RN, RD, CDE.

## 2012-02-23 NOTE — Progress Notes (Signed)
+  FM. Irregular ctx. < 4 per hour. CL :1.7 with cerclage intact on [place]. Growth [place] 02/16/12: HC <3% Growth overall 45%, AFI NL. Not chking BS 2 taken in last week: fasting 95 2 pp 160. Maggie today. FU 1 week to review BS. Likely will need glyberide start at next visit.

## 2012-03-01 ENCOUNTER — Ambulatory Visit (INDEPENDENT_AMBULATORY_CARE_PROVIDER_SITE_OTHER): Payer: BC Managed Care – PPO | Admitting: Physician Assistant

## 2012-03-01 VITALS — BP 116/80 | Temp 98.0°F | Wt 199.7 lb

## 2012-03-01 DIAGNOSIS — O24419 Gestational diabetes mellitus in pregnancy, unspecified control: Secondary | ICD-10-CM

## 2012-03-01 DIAGNOSIS — O99810 Abnormal glucose complicating pregnancy: Secondary | ICD-10-CM

## 2012-03-01 LAB — POCT URINALYSIS DIP (DEVICE)
Bilirubin Urine: NEGATIVE
Nitrite: NEGATIVE
Protein, ur: 30 mg/dL — AB
Specific Gravity, Urine: >=1.03 (ref 1.005–1.030)
Urobilinogen, UA: 0.2 mg/dL (ref 0.0–1.0)
pH: 6 (ref 5.0–8.0)

## 2012-03-01 MED ORDER — GLYBURIDE 2.5 MG PO TABS: 2.5000 mg | ORAL_TABLET | Freq: Every day | ORAL | Status: DC

## 2012-03-01 NOTE — Progress Notes (Signed)
FBS still elevated. Will add 2.5mg  glyburide at hs today. Pt now working 1st shift which will also help with eating regularly and getting exercise. Plans BTS with [facility], papers signed today. Will start testing at next visit.

## 2012-03-01 NOTE — Progress Notes (Signed)
Pulse: 93

## 2012-03-01 NOTE — Patient Instructions (Addendum)
Laparoscopic Tubal Ligation Laparoscopic tubal ligation is a procedure that closes the fallopian tubes. Tubal ligation is also known as getting your "tubes tied." It is a brief, common and relatively simple surgical procedure. Tubal ligation is done to cause sterilization. Sterilization means you will not be able to get pregnant or have a baby. Sometimes a tubal ligation may be reversed, but this should not be considered a possibility because of failure to get pregnant and the increased risk of tubal (ectopic) pregnancy. If you want to have future pregnancies, you should not have this procedure. Use other forms of contraception. Tubal ligation can be done at any time during your menstrual cycle. This procedure has a less than 1% failure rate. Tubal ligation does not protect against sexually transmitted disease. LET YOUR CAREGIVER KNOW ABOUT:  Allergies, especially to medicines.   All the medicines you are taking, even herbs, eyedrops, steroids, creams, and over-the-counter drugs.   The possibility of being pregnant.   Past problems with medicines.   History of blood clots or other blood problems.   Past surgeries, medical or health problems.  RISKS AND COMPLICATIONS  Your caregiver will discuss the risks with you before the procedure. Some problems that can happen after this procedure include:  Infection. A germ starts growing in one of the wounds. This can usually be treated with antibiotic medicines.   Bleeding following surgery. Your surgeon takes every precaution to keep this from happening.   Damage to other organs. If damage to other organs or excessive bleeding should occur, it may be necessary to convert the laparoscopic procedure into an open abdominal procedure. Scarring (adhesions) from past surgeries or disease may also be a cause to change this procedure to an open abdominal operation.   Anesthetic side effects.  BEFORE THE PROCEDURE  Do not take aspirin or blood thinners a  week before the procedure. This can cause bleeding. Do as directed by your caregiver.   Do not eat or drink anything 6 to 8 hours before the procedure.   Let your caregiver know if you get a cold or an infection before the procedure.   Arrive at the clinic or hospital 60 minutes before the surgery or as directed.  PROCEDURE   You may be given a mild drug (sedative) to help you relax before the procedure. Once in the operating room, you will be given a general anesthetic to make you sleep (unless you and your caregiver choose a different anesthetic).   Once you are sleeping, your surgeon makes your abdomen larger with a harmless gas (carbon dioxide). This makes your organs easier to see and gives room to operate.   A laparoscope is then inserted into your abdomen through a small cut (incision) below the navel. A laparoscope is a thin, lighted, pencil-sized instrument. It is like a telescope. Once inserted, your caregiver can look at the organs inside your abdomen. He or she can see if there is anything abnormal.   Other small instruments also are put into the abdomen through other small openings (portals). Many surgeons attach a video camera to the laparoscope to enlarge the view. The fallopian tubes are located and either burned closed (cauterized) or a plastic clip is placed on them to close the tubes.  Sometimes, your surgeon may take tissue samples (biopsies) from other organs if he or she sees something abnormal. Biopsies can help to diagnose or confirm a disease. The samples are examined under a microscope. AFTER THE PROCEDURE   The gas   is released from inside your abdomen.   Your incisions are closed with stitches (sutures). Because these incisions are small (usually less than  inch), there is usually little discomfort following the procedure.   You will rest in a recovery room until you are stable and doing well.   As long as there are no problems, you may be allowed to go home.    Someone will need to drive you home.   You will have some mild discomfort in the throat. This is from the tube placed in your throat while you were sleeping.   You may experience discomfort in the shoulder area from some trapped air between the liver and diaphragm. This will slowly go away on its own.   You will also have some mild abdominal discomfort. You will also have discomfort from the incisions where the instruments were placed into your abdomen.  HOME CARE INSTRUCTIONS   Only take over-the-counter or prescription medicines for pain, discomfort, or fever as directed by your caregiver. Do not take aspirin. It can cause bleeding.   Resume daily activities, diet, rest, driving, and work as directed.   Showers are preferred over baths.   Resume sexual activities in 1 week or as directed.   If biopsies were taken, find out how to get your results. The results are usually given during the follow-up visit. Do not assume tests are normal if you do not hear from your caregiver. It is your responsibility to obtain your results.   Change the dressings as directed.   It is helpful to have someone with you for several hours after you go home. They can help you and be available if you have problems.  SEEK MEDICAL CARE IF:   You have increasing abdominal pain.   You develop new pain in your shoulders in the shoulder strap area that gets worse with time. Some pain is common and expected.   You feel lightheaded or faint.   You have chills or fever.   You develop bleeding or drainage from the suture sites or the vagina after surgery.   You develop chest pain.   You develop shortness of breath.   You develop a rash.  Document Released: 11/10/2000 Document Revised: 07/24/2011 Document Reviewed: 02/23/2008 ExitCare Patient Information 2012 [employer]. 

## 2012-03-15 ENCOUNTER — Other Ambulatory Visit: Payer: BC Managed Care – PPO

## 2012-03-15 ENCOUNTER — Ambulatory Visit (HOSPITAL_COMMUNITY): Admission: RE | Admit: 2012-03-15 | Payer: BC Managed Care – PPO | Source: Ambulatory Visit

## 2012-03-15 ENCOUNTER — Encounter: Payer: Self-pay | Admitting: Obstetrics and Gynecology

## 2012-03-15 ENCOUNTER — Ambulatory Visit (HOSPITAL_COMMUNITY): Payer: BC Managed Care – PPO

## 2012-03-22 ENCOUNTER — Ambulatory Visit (INDEPENDENT_AMBULATORY_CARE_PROVIDER_SITE_OTHER): Payer: BC Managed Care – PPO | Admitting: Physician Assistant

## 2012-03-22 VITALS — BP 121/79 | Temp 98.5°F | Wt 203.4 lb

## 2012-03-22 DIAGNOSIS — O99810 Abnormal glucose complicating pregnancy: Secondary | ICD-10-CM

## 2012-03-22 DIAGNOSIS — O24419 Gestational diabetes mellitus in pregnancy, unspecified control: Secondary | ICD-10-CM

## 2012-03-22 LAB — POCT URINALYSIS DIP (DEVICE)
Bilirubin Urine: NEGATIVE
Bilirubin Urine: NEGATIVE
Ketones, ur: 15 mg/dL — AB
Ketones, ur: 15 mg/dL — AB
Leukocytes, UA: NEGATIVE
Nitrite: NEGATIVE
Protein, ur: 100 mg/dL — AB
Protein, ur: 100 mg/dL — AB
Specific Gravity, Urine: 1.025 (ref 1.005–1.030)
pH: 6.5 (ref 5.0–8.0)

## 2012-03-22 NOTE — Progress Notes (Signed)
Pt not chking BS since starting Glyburide. Reports taking nightly. Strongly encouraged to [facility] BS as directed. Will order scan to assess growth s>d. Random BS 81. Pt missed last week appts. Will start 2 x weekly testing today.

## 2012-03-22 NOTE — Addendum Note (Signed)
Addended by: Maylon Cos E on: 03/22/2012 11:18 AM   Modules accepted: Orders, Level of Service

## 2012-03-22 NOTE — Progress Notes (Signed)
P=87,States hasn't been checking cbg's since on glyburide and states feels fine, discussed important to continue checking cbg's so we know what cbg's are and if the medicine is working and is right dose. C/o trace edema in feet/hands, c/o irregular contractions 4-5 times a day,

## 2012-03-22 NOTE — Addendum Note (Signed)
Addended by: Kathee Delton on: 03/22/2012 12:01 PM   Modules accepted: Orders

## 2012-03-22 NOTE — Patient Instructions (Signed)
Pregnancy - Third Trimester The third trimester of pregnancy (the last 3 months) is a period of the most rapid growth for you and your baby. The baby approaches a length of 20 inches and a weight of 6 to 10 pounds. The baby is adding on fat and getting ready for life outside your body. While inside, babies have periods of sleeping and waking, suck their thumbs, and hiccups. You can often feel small contractions of the uterus. This is false labor. It is also called Braxton-Hicks contractions. This is like a practice for labor. The usual problems in this stage of pregnancy include more difficulty breathing, swelling of the hands and feet from water retention, and having to urinate more often because of the uterus and baby pressing on your bladder.  PRENATAL EXAMS  Blood work may continue to be done during prenatal exams. These tests are done to check on your health and the probable health of your baby. Blood work is used to follow your blood levels (hemoglobin). Anemia (low hemoglobin) is common during pregnancy. Iron and vitamins are given to help prevent this. You may also continue to be checked for diabetes. Some of the past blood tests may be done again.   The size of the uterus is measured during each visit. This makes sure your baby is growing properly according to your pregnancy dates.   Your blood pressure is checked every prenatal visit. This is to make sure you are not getting toxemia.   Your urine is checked every prenatal visit for infection, diabetes and protein.   Your weight is checked at each visit. This is done to make sure gains are happening at the suggested rate and that you and your baby are growing normally.   Sometimes, an ultrasound is performed to confirm the position and the proper growth and development of the baby. This is a test done that bounces harmless sound waves off the baby so your caregiver can more accurately determine due dates.   Discuss the type of pain  medication and anesthesia you will have during your labor and delivery.   Discuss the possibility and anesthesia if a Cesarean Section might be necessary.   Inform your caregiver if there is any mental or physical violence at home.  Sometimes, a specialized non-stress test, contraction stress test and biophysical profile are done to make sure the baby is not having a problem. Checking the amniotic fluid surrounding the baby is called an amniocentesis. The amniotic fluid is removed by sticking a needle into the belly (abdomen). This is sometimes done near the end of pregnancy if an early delivery is required. In this case, it is done to help make sure the baby's lungs are mature enough for the baby to live outside of the womb. If the lungs are not mature and it is unsafe to deliver the baby, an injection of cortisone medication is given to the mother 1 to 2 days before the delivery. This helps the baby's lungs mature and makes it safer to deliver the baby. CHANGES OCCURING IN THE THIRD TRIMESTER OF PREGNANCY Your body goes through many changes during pregnancy. They vary from person to person. Talk to your caregiver about changes you notice and are concerned about.  During the last trimester, you have probably had an increase in your appetite. It is normal to have cravings for certain foods. This varies from person to person and pregnancy to pregnancy.   You may begin to get stretch marks on your hips,   abdomen, and breasts. These are normal changes in the body during pregnancy. There are no exercises or medications to take which prevent this change.   Constipation may be treated with a stool softener or adding bulk to your diet. Drinking lots of fluids, fiber in vegetables, fruits, and whole grains are helpful.   Exercising is also helpful. If you have been very active up until your pregnancy, most of these activities can be continued during your pregnancy. If you have been less active, it is helpful  to start an exercise program such as walking. Consult your caregiver before starting exercise programs.   Avoid all smoking, alcohol, un-prescribed drugs, herbs and "street drugs" during your pregnancy. These chemicals affect the formation and growth of the baby. Avoid chemicals throughout the pregnancy to ensure the delivery of a healthy infant.   Backache, varicose veins and hemorrhoids may develop or get worse.   You will tire more easily in the third trimester, which is normal.   The baby's movements may be stronger and more often.   You may become short of breath easily.   Your belly button may stick out.   A yellow discharge may leak from your breasts called colostrum.   You may have a bloody mucus discharge. This usually occurs a few days to a week before labor begins.  HOME CARE INSTRUCTIONS   Keep your caregiver's appointments. Follow your caregiver's instructions regarding medication use, exercise, and diet.   During pregnancy, you are providing food for you and your baby. Continue to eat regular, well-balanced meals. Choose foods such as meat, fish, milk and other low fat dairy products, vegetables, fruits, and whole-grain breads and cereals. Your caregiver will tell you of the ideal weight gain.   A physical sexual relationship may be continued throughout pregnancy if there are no other problems such as early (premature) leaking of amniotic fluid from the membranes, vaginal bleeding, or belly (abdominal) pain.   Exercise regularly if there are no restrictions. Check with your caregiver if you are unsure of the safety of your exercises. Greater weight gain will occur in the last 2 trimesters of pregnancy. Exercising helps:   Control your weight.   Get you in shape for labor and delivery.   You lose weight after you deliver.   Rest a lot with legs elevated, or as needed for leg cramps or low back pain.   Wear a good support or jogging bra for breast tenderness during  pregnancy. This may help if worn during sleep. Pads or tissues may be used in the bra if you are leaking colostrum.   Do not use hot tubs, steam rooms, or saunas.   Wear your seat belt when driving. This protects you and your baby if you are in an accident.   Avoid raw meat, cat litter boxes and soil used by cats. These carry germs that can cause birth defects in the baby.   It is easier to loose urine during pregnancy. Tightening up and strengthening the pelvic muscles will help with this problem. You can practice stopping your urination while you are going to the bathroom. These are the same muscles you need to strengthen. It is also the muscles you would use if you were trying to stop from passing gas. You can practice tightening these muscles up 10 times a set and repeating this about 3 times per day. Once you know what muscles to tighten up, do not perform these exercises during urination. It is more likely   to cause an infection by backing up the urine.   Ask for help if you have financial, counseling or nutritional needs during pregnancy. Your caregiver will be able to offer counseling for these needs as well as refer you for other special needs.   Make a list of emergency phone numbers and have them available.   Plan on getting help from family or friends when you go home from the hospital.   Make a trial run to the hospital.   Take prenatal classes with the father to understand, practice and ask questions about the labor and delivery.   Prepare the baby's room/nursery.   Do not travel out of the city unless it is absolutely necessary and with the advice of your caregiver.   Wear only low or no heal shoes to have better balance and prevent falling.  MEDICATIONS AND DRUG USE IN PREGNANCY  Take prenatal vitamins as directed. The vitamin should contain 1 milligram of folic acid. Keep all vitamins out of reach of children. Only a couple vitamins or tablets containing iron may be fatal  to a baby or young child when ingested.   Avoid use of all medications, including herbs, over-the-counter medications, not prescribed or suggested by your caregiver. Only take over-the-counter or prescription medicines for pain, discomfort, or fever as directed by your caregiver. Do not use aspirin, ibuprofen (Motrin, Advil, Nuprin) or naproxen (Aleve) unless OK'd by your caregiver.   Let your caregiver also know about herbs you may be using.   Alcohol is related to a number of birth defects. This includes fetal alcohol syndrome. All alcohol, in any form, should be avoided completely. Smoking will cause low birth rate and premature babies.   Street/illegal drugs are very harmful to the baby. They are absolutely forbidden. A baby born to an addicted mother will be addicted at birth. The baby will go through the same withdrawal an adult does.  SEEK MEDICAL CARE IF: You have any concerns or worries during your pregnancy. It is better to call with your questions if you feel they cannot wait, rather than worry about them. DECISIONS ABOUT CIRCUMCISION You may or may not know the sex of your baby. If you know your baby is a boy, it may be time to think about circumcision. Circumcision is the removal of the foreskin of the penis. This is the skin that covers the sensitive end of the penis. There is no proven medical need for this. Often this decision is made on what is popular at the time or based upon religious beliefs and social issues. You can discuss these issues with your caregiver or pediatrician. SEEK IMMEDIATE MEDICAL CARE IF:   An unexplained oral temperature above 102 F (38.9 C) develops, or as your caregiver suggests.   You have leaking of fluid from the vagina (birth canal). If leaking membranes are suspected, take your temperature and tell your caregiver of this when you call.   There is vaginal spotting, bleeding or passing clots. Tell your caregiver of the amount and how many pads are  used.   You develop a bad smelling vaginal discharge with a change in the color from clear to white.   You develop vomiting that lasts more than 24 hours.   You develop chills or fever.   You develop shortness of breath.   You develop burning on urination.   You loose more than 2 pounds of weight or gain more than 2 pounds of weight or as suggested by your   caregiver.   You notice sudden swelling of your face, hands, and feet or legs.   You develop belly (abdominal) pain. Round ligament discomfort is a common non-cancerous (benign) cause of abdominal pain in pregnancy. Your caregiver still must evaluate you.   You develop a severe headache that does not go away.   You develop visual problems, blurred or double vision.   If you have not felt your baby move for more than 1 hour. If you think the baby is not moving as much as usual, eat something with sugar in it and lie down on your left side for an hour. The baby should move at least 4 to 5 times per hour. Call right away if your baby moves less than that.   You fall, are in a car accident or any kind of trauma.   There is mental or physical violence at home.  Document Released: 07/29/2001 Document Revised: 07/24/2011 Document Reviewed: 01/31/2009 [employer] Patient Information 2012 [employer]. 

## 2012-03-23 DIAGNOSIS — O0990 Supervision of high risk pregnancy, unspecified, unspecified trimester: Secondary | ICD-10-CM

## 2012-03-24 LAB — CULTURE, OB URINE

## 2012-03-25 ENCOUNTER — Ambulatory Visit (INDEPENDENT_AMBULATORY_CARE_PROVIDER_SITE_OTHER): Payer: BC Managed Care – PPO | Admitting: *Deleted

## 2012-03-25 ENCOUNTER — Ambulatory Visit (HOSPITAL_COMMUNITY)
Admission: RE | Admit: 2012-03-25 | Discharge: 2012-03-25 | Disposition: A | Discharge status: Home / Self Care | Payer: BC Managed Care – PPO | Source: Ambulatory Visit | Attending: Physician Assistant | Admitting: Physician Assistant

## 2012-03-25 ENCOUNTER — Ambulatory Visit (HOSPITAL_COMMUNITY): Payer: BC Managed Care – PPO

## 2012-03-25 VITALS — BP 125/77 | Wt 206.9 lb

## 2012-03-25 DIAGNOSIS — O09529 Supervision of elderly multigravida, unspecified trimester: Secondary | ICD-10-CM | POA: Insufficient documentation

## 2012-03-25 DIAGNOSIS — O99810 Abnormal glucose complicating pregnancy: Secondary | ICD-10-CM | POA: Insufficient documentation

## 2012-03-25 DIAGNOSIS — O24419 Gestational diabetes mellitus in pregnancy, unspecified control: Secondary | ICD-10-CM

## 2012-03-25 NOTE — Progress Notes (Signed)
P = 90    [place] growth today

## 2012-03-29 ENCOUNTER — Ambulatory Visit (INDEPENDENT_AMBULATORY_CARE_PROVIDER_SITE_OTHER): Payer: BC Managed Care – PPO | Admitting: Obstetrics & Gynecology

## 2012-03-29 VITALS — BP 132/86 | Temp 99.3°F | Wt 205.8 lb

## 2012-03-29 DIAGNOSIS — O99810 Abnormal glucose complicating pregnancy: Secondary | ICD-10-CM

## 2012-03-29 DIAGNOSIS — O24419 Gestational diabetes mellitus in pregnancy, unspecified control: Secondary | ICD-10-CM

## 2012-03-29 DIAGNOSIS — O09529 Supervision of elderly multigravida, unspecified trimester: Secondary | ICD-10-CM

## 2012-03-29 LAB — POCT URINALYSIS DIP (DEVICE)
Ketones, ur: NEGATIVE mg/dL
Leukocytes, UA: NEGATIVE
Protein, ur: 100 mg/dL — AB
Urobilinogen, UA: 0.2 mg/dL (ref 0.0–1.0)
pH: 6.5 (ref 5.0–8.0)

## 2012-03-29 NOTE — Progress Notes (Signed)
NST reactive today.  40% growth and nml fluid on 03/26/12.  CBG 2 hours after eating today is 95.  Cervix closed on exam.  Urine is not clean catch.  No symptoms of UTI.  BP nml.

## 2012-03-29 NOTE — Progress Notes (Signed)
Pt has not been checking blood sugars.  Pt understands the risk of hyperglycemia and fetal death.

## 2012-03-29 NOTE — Progress Notes (Signed)
P = 87 

## 2012-03-29 NOTE — Patient Instructions (Addendum)
Breastfeeding BENEFITS OF BREASTFEEDING For the baby  The first milk (colostrum) helps the baby's digestive system function better.   There are antibodies from the mother in the milk that help the baby fight off infections.   The baby has a lower incidence of asthma, allergies, and SIDS (sudden infant death syndrome).   The nutrients in breast milk are better than formulas for the baby and helps the baby's brain grow better.   Babies who breastfeed have less gas, colic, and constipation.  For the mother  Breastfeeding helps develop a very special bond between mother and baby.   It is more convenient, always available at the correct temperature and cheaper than formula feeding.   It burns calories in the mother and helps with losing weight that was gained during pregnancy.   It makes the uterus contract back down to normal size faster and slows bleeding following delivery.   Breastfeeding mothers have a lower risk of developing breast cancer.  NURSE FREQUENTLY  A healthy, full-term baby may breastfeed as often as every hour or space his or her feedings to every 3 hours.   How often to nurse will vary from baby to baby. Watch your baby for signs of hunger, not the clock.   Nurse as often as the baby requests, or when you feel the need to reduce the fullness of your breasts.   Awaken the baby if it has been 3 to 4 hours since the last feeding.   Frequent feeding will help the mother make more milk and will prevent problems like sore nipples and engorgement of the breasts.  BABY'S POSITION AT THE BREAST  Whether lying down or sitting, be sure that the baby's tummy is facing your tummy.   Support the breast with 4 fingers underneath the breast and the thumb above. Make sure your fingers are well away from the nipple and baby's mouth.   Stroke the baby's lips and cheek closest to the breast gently with your finger or nipple.   When the baby's mouth is open wide enough, place all  of your nipple and as much of the dark area around the nipple as possible into your baby's mouth.   Pull the baby in close so the tip of the nose and the baby's cheeks touch the breast during the feeding.  FEEDINGS  The length of each feeding varies from baby to baby and from feeding to feeding.   The baby must suck about 2 to 3 minutes for your milk to get to him or her. This is called a "let down." For this reason, allow the baby to feed on each breast as long as he or she wants. Your baby will end the feeding when he or she has received the right balance of nutrients.   To break the suction, put your finger into the corner of the baby's mouth and slide it between his or her gums before removing your breast from his or her mouth. This will help prevent sore nipples.  REDUCING BREAST ENGORGEMENT  In the first week after your baby is born, you may experience signs of breast engorgement. When breasts are engorged, they feel heavy, warm, full, and may be tender to the touch. You can reduce engorgement if you:   Nurse frequently, every 2 to 3 hours. Mothers who breastfeed early and often have fewer problems with engorgement.   Place light ice packs on your breasts between feedings. This reduces swelling. Wrap the ice packs in a   lightweight towel to protect your skin.   Apply moist hot packs to your breast for 5 to 10 minutes before each feeding. This increases circulation and helps the milk flow.   Gently massage your breast before and during the feeding.   Make sure that the baby empties at least one breast at every feeding before switching sides.   Use a breast pump to empty the breasts if your baby is sleepy or not nursing well. You may also want to pump if you are returning to work or or you feel you are getting engorged.   Avoid bottle feeds, pacifiers or supplemental feedings of water or juice in place of breastfeeding.   Be sure the baby is latched on and positioned properly while  breastfeeding.   Prevent fatigue, stress, and anemia.   Wear a supportive bra, avoiding underwire styles.   Eat a balanced diet with enough fluids.  If you follow these suggestions, your engorgement should improve in 24 to 48 hours. If you are still experiencing difficulty, call your lactation consultant or caregiver. IS MY BABY GETTING ENOUGH MILK? Sometimes, mothers worry about whether their babies are getting enough milk. You can be assured that your baby is getting enough milk if:  The baby is actively sucking and you hear swallowing.   The baby nurses at least 8 to 12 times in a 24 hour time period. Nurse your baby until he or she unlatches or falls asleep at the first breast (at least 10 to 20 minutes), then offer the second side.   The baby is wetting 5 to 6 disposable diapers (6 to 8 cloth diapers) in a 24 hour period by 5 to 6 days of age.   The baby is having at least 2 to 3 stools every 24 hours for the first few months. Breast milk is all the food your baby needs. It is not necessary for your baby to have water or formula. In fact, to help your breasts make more milk, it is best not to give your baby supplemental feedings during the early weeks.   The stool should be soft and yellow.   The baby should gain 4 to 7 ounces per week after he is 4 days old.  TAKE CARE OF YOURSELF Take care of your breasts by:  Bathing or showering daily.   Avoiding the use of soaps on your nipples.   Start feedings on your left breast at one feeding and on your right breast at the next feeding.   You will notice an increase in your milk supply 2 to 5 days after delivery. You may feel some discomfort from engorgement, which makes your breasts very firm and often tender. Engorgement "peaks" out within 24 to 48 hours. In the meantime, apply warm moist towels to your breasts for 5 to 10 minutes before feeding. Gentle massage and expression of some milk before feeding will soften your breasts, making  it easier for your baby to latch on. Wear a well fitting nursing bra and air dry your nipples for 10 to 15 minutes after each feeding.   Only use cotton bra pads.   Only use pure lanolin on your nipples after nursing. You do not need to wash it off before nursing.  Take care of yourself by:   Eating well-balanced meals and nutritious snacks.   Drinking milk, fruit juice, and water to satisfy your thirst (about 8 glasses a day).   Getting plenty of rest.   Increasing calcium in   your diet (1200 mg a day).   Avoiding foods that you notice affect the baby in a bad way.  SEEK MEDICAL CARE IF:   You have any questions or difficulty with breastfeeding.   You need help.   You have a hard, red, sore area on your breast, accompanied by a fever of 100.5 F (38.1 C) or more.   Your baby is too sleepy to eat well or is having trouble sleeping.   Your baby is wetting less than 6 diapers per day, by 5 days of age.   Your baby's skin or white part of his or her eyes is more yellow than it was in the hospital.   You feel depressed.  Document Released: 08/04/2005 Document Revised: 07/24/2011 Document Reviewed: 03/19/2009 [employer] Patient Information 2012 [employer]. 

## 2012-03-30 ENCOUNTER — Other Ambulatory Visit: Payer: Self-pay | Admitting: *Deleted

## 2012-03-30 DIAGNOSIS — O99810 Abnormal glucose complicating pregnancy: Secondary | ICD-10-CM

## 2012-03-30 NOTE — Progress Notes (Signed)
NST 03/25/12 reactive 

## 2012-03-31 ENCOUNTER — Telehealth: Payer: Self-pay | Admitting: *Deleted

## 2012-03-31 NOTE — Telephone Encounter (Signed)
Called pt and informed of [place] appt 04/01/12 @ 1515.  Pt voiced understanding.

## 2012-04-01 ENCOUNTER — Ambulatory Visit (HOSPITAL_COMMUNITY)
Admission: RE | Admit: 2012-04-01 | Discharge: 2012-04-01 | Disposition: A | Discharge status: Home / Self Care | Payer: BC Managed Care – PPO | Source: Ambulatory Visit | Attending: Obstetrics & Gynecology | Admitting: Obstetrics & Gynecology

## 2012-04-01 ENCOUNTER — Ambulatory Visit (INDEPENDENT_AMBULATORY_CARE_PROVIDER_SITE_OTHER): Payer: BC Managed Care – PPO | Admitting: *Deleted

## 2012-04-01 VITALS — BP 133/81 | Temp 98.5°F | Wt 208.2 lb

## 2012-04-01 DIAGNOSIS — O09529 Supervision of elderly multigravida, unspecified trimester: Secondary | ICD-10-CM | POA: Insufficient documentation

## 2012-04-01 DIAGNOSIS — O99810 Abnormal glucose complicating pregnancy: Secondary | ICD-10-CM | POA: Insufficient documentation

## 2012-04-01 DIAGNOSIS — O09299 Supervision of pregnancy with other poor reproductive or obstetric history, unspecified trimester: Secondary | ICD-10-CM | POA: Insufficient documentation

## 2012-04-01 DIAGNOSIS — O34219 Maternal care for unspecified type scar from previous cesarean delivery: Secondary | ICD-10-CM | POA: Insufficient documentation

## 2012-04-01 DIAGNOSIS — Z8751 Personal history of pre-term labor: Secondary | ICD-10-CM | POA: Insufficient documentation

## 2012-04-01 NOTE — Progress Notes (Signed)
P=91, 

## 2012-04-05 ENCOUNTER — Ambulatory Visit (INDEPENDENT_AMBULATORY_CARE_PROVIDER_SITE_OTHER): Payer: BC Managed Care – PPO | Admitting: Family Medicine

## 2012-04-05 VITALS — BP 147/88 | Temp 97.6°F | Wt 209.8 lb

## 2012-04-05 DIAGNOSIS — O99810 Abnormal glucose complicating pregnancy: Secondary | ICD-10-CM

## 2012-04-05 DIAGNOSIS — O139 Gestational [pregnancy-induced] hypertension without significant proteinuria, unspecified trimester: Secondary | ICD-10-CM

## 2012-04-05 DIAGNOSIS — O24419 Gestational diabetes mellitus in pregnancy, unspecified control: Secondary | ICD-10-CM

## 2012-04-05 LAB — CBC
HCT: 31.3 % — ABNORMAL LOW (ref 36.0–46.0)
MCHC: 34.5 g/dL (ref 30.0–36.0)
MCV: 74.9 fL — ABNORMAL LOW (ref 78.0–100.0)
Platelets: 162 10*3/uL (ref 150–400)
RDW: 15.7 % — ABNORMAL HIGH (ref 11.5–15.5)

## 2012-04-05 LAB — POCT URINALYSIS DIP (DEVICE)
Ketones, ur: NEGATIVE mg/dL
Leukocytes, UA: NEGATIVE
Protein, ur: 100 mg/dL — AB
Specific Gravity, Urine: 1.025 (ref 1.005–1.030)
Urobilinogen, UA: 0.2 mg/dL (ref 0.0–1.0)
pH: 6 (ref 5.0–8.0)

## 2012-04-05 NOTE — Progress Notes (Signed)
Pulse: 90

## 2012-04-05 NOTE — Patient Instructions (Signed)
Hypertension During Pregnancy Hypertension is also called high blood pressure. It can occur at any time in life and during pregnancy. When you have hypertension, there is extra pressure inside your blood vessels that carry blood from the heart to the rest of your body (arteries). Hypertension during pregnancy can cause problems for you and your baby. Your baby might not weigh as much as it should at birth or might be born early (premature). Very bad cases of hypertension during pregnancy can be life-threatening.  There are different types of hypertension during pregnancy.   Chronic hypertension. This happens when a woman has hypertension before pregnancy and it continues during pregnancy.   Gestational hypertension. This is when hypertension develops during pregnancy.   Preeclampsia or toxemia of pregnancy. This is a very serious type of hypertension that develops only during pregnancy. It is a disease that affects the whole body (systemic) and can be very dangerous for both mother and baby.   Gestational hypertension and preeclampsia usually go away after your baby is born. Blood pressure generally stabilizes within 6 weeks. Women who have hypertension during pregnancy have a greater chance of developing hypertension later in life or with future pregnancies. UNDERSTANDING BLOOD PRESSURE Blood pressure moves blood in your body. Sometimes, the force that moves the blood becomes too strong.  A blood pressure reading is given in 2 numbers and looks like a fraction.   The top number is called the systolic pressure. When your heart beats, it forces more blood to flow through the arteries. Pressure inside the arteries goes up.   The bottom number is the diastolic pressure. Pressure goes down between beats. That is when the heart is resting.   You may have hypertension if:   Your systolic blood pressure is above 140.   Your diastolic pressure is above 90.  RISK FACTORS Some factors make you more  likely to develop hypertension during pregnancy. Risk factors include:  Having hypertension before pregnancy.   Having hypertension during a previous pregnancy.   Being overweight.   Being older than 40.   Being pregnant with more than 1 baby (multiples).   Having diabetes or kidney problems.  SYMPTOMS Chronic and gestational hypertension may not cause symptoms. Preeclampsia has symptoms, which may include:  Increased protein in your urine. Your caregiver will check for this at every prenatal visit.   Swelling of your hands and face.   Rapid weight gain.   Headaches.   Visual changes.   Being bothered by light.   Abdominal pain, especially in the right upper area.   Chest pain.   Shortness of breath.   Increased reflexes.   Seizures. Seizures occur with a more severe form of preeclampsia, called eclampsia.  DIAGNOSIS   You may be diagnosed with hypertension during pregnancy during a regular prenatal exam. At each visit, tests may include:   Blood pressure checks.   A urine test to check for protein in your urine.   The type of hypertension you are diagnosed with depends on when you developed it. It also depends on your specific blood pressure reading.   Developing hypertension before 20 weeks of pregnancy is consistent with chronic hypertension.   Developing hypertension after 20 weeks of pregnancy is consistent with gestational hypertension.   Hypertension with increased urinary protein is diagnosed as preeclampsia.   Blood pressure measurements that stay above 160 systolic or 110 diastolic are a sign of severe preeclampsia.  TREATMENT Treatment for hypertension during pregnancy varies. Treatment depends on   the type of hypertension and how serious it is.  If you take medicine for chronic hypertension, you may need to switch medicines.   Drugs called ACE inhibitors should not be taken during pregnancy.   Low-dose aspirin may be suggested for women who have  risk factors for preeclampsia.   If you have gestational hypertension, you may need to take a blood pressure medicine that is safe during pregnancy. Your caregiver will recommend the appropriate medicine.   If you have severe preeclampsia, you may need to be in the hospital. Caregivers will watch you and the baby very closely. You also may need to take medicine (magnesium sulfate) to prevent seizures and lower blood pressure.   Sometimes an early delivery is needed. This may be the case if the condition worsens. It would be done to protect you and the baby. The only cure for preeclampsia is delivery.  HOME CARE INSTRUCTIONS  Schedule and keep all of your regular prenatal care.   Follow your caregiver's instructions for taking medicines. Tell your caregiver about all medicines you take. This includes over-the-counter medicines.   Eat as little salt as possible.   Get regular exercise.   Do not drink alcohol.   Do not use tobacco products.   Do not drink products with caffeine.   Lie on your left side when resting.   Tell your doctor if you have any preeclampsia symptoms.  SEEK IMMEDIATE MEDICAL CARE IF:  You have severe abdominal pain.   You have sudden swelling in the hands, ankles, or face.   You gain 4 pounds (1.8 kg) or more in 1 week.   You vomit repeatedly.   You have vaginal bleeding.   You do not feel the baby moving as much.   You have a headache.   You have blurred or double vision.   You have muscle twitching or spasms.   You have shortness of breath.   You have blue fingernails and lips.   You have blood in your urine.  MAKE SURE YOU:  Understand these instructions.   Will watch your condition.   Will get help right away if you are not doing well.  Document Released: 04/22/2011 Document Revised: 07/24/2011 Document Reviewed: 04/22/2011 [facility] Patient Information 2012 [place], [place].   Pregnancy - Third Trimester The third trimester of  pregnancy (the last 3 months) is a period of the most rapid growth for you and your baby. The baby approaches a length of 20 inches and a weight of 6 to 10 pounds. The baby is adding on fat and getting ready for life outside your body. While inside, babies have periods of sleeping and waking, suck their thumbs, and hiccups. You can often feel small contractions of the uterus. This is false labor. It is also called Braxton-Hicks contractions. This is like a practice for labor. The usual problems in this stage of pregnancy include more difficulty breathing, swelling of the hands and feet from water retention, and having to urinate more often because of the uterus and baby pressing on your bladder.  PRENATAL EXAMS  Blood work may continue to be done during prenatal exams. These tests are done to check on your health and the probable health of your baby. Blood work is used to follow your blood levels (hemoglobin). Anemia (low hemoglobin) is common during pregnancy. Iron and vitamins are given to help prevent this. You may also continue to be checked for diabetes. Some of the past blood tests may be done again.  The size of the uterus is measured during each visit. This makes sure your baby is growing properly according to your pregnancy dates.   Your blood pressure is checked every prenatal visit. This is to make sure you are not getting toxemia.   Your urine is checked every prenatal visit for infection, diabetes and protein.   Your weight is checked at each visit. This is done to make sure gains are happening at the suggested rate and that you and your baby are growing normally.   Sometimes, an ultrasound is performed to confirm the position and the proper growth and development of the baby. This is a test done that bounces harmless sound waves off the baby so your caregiver can more accurately determine due dates.   Discuss the type of pain medication and anesthesia you will have during your labor and  delivery.   Discuss the possibility and anesthesia if a Cesarean Section might be necessary.   Inform your caregiver if there is any mental or physical violence at home.  Sometimes, a specialized non-stress test, contraction stress test and biophysical profile are done to make sure the baby is not having a problem. Checking the amniotic fluid surrounding the baby is called an amniocentesis. The amniotic fluid is removed by sticking a needle into the belly (abdomen). This is sometimes done near the end of pregnancy if an early delivery is required. In this case, it is done to help make sure the baby's lungs are mature enough for the baby to live outside of the womb. If the lungs are not mature and it is unsafe to deliver the baby, an injection of cortisone medication is given to the mother 1 to 2 days before the delivery. This helps the baby's lungs mature and makes it safer to deliver the baby. CHANGES OCCURING IN THE THIRD TRIMESTER OF PREGNANCY Your body goes through many changes during pregnancy. They vary from person to person. Talk to your caregiver about changes you notice and are concerned about.  During the last trimester, you have probably had an increase in your appetite. It is normal to have cravings for certain foods. This varies from person to person and pregnancy to pregnancy.   You may begin to get stretch marks on your hips, abdomen, and breasts. These are normal changes in the body during pregnancy. There are no exercises or medications to take which prevent this change.   Constipation may be treated with a stool softener or adding bulk to your diet. Drinking lots of fluids, fiber in vegetables, fruits, and whole grains are helpful.   Exercising is also helpful. If you have been very active up until your pregnancy, most of these activities can be continued during your pregnancy. If you have been less active, it is helpful to start an exercise program such as walking. Consult your  caregiver before starting exercise programs.   Avoid all smoking, alcohol, un-prescribed drugs, herbs and "street drugs" during your pregnancy. These chemicals affect the formation and growth of the baby. Avoid chemicals throughout the pregnancy to ensure the delivery of a healthy infant.   Backache, varicose veins and hemorrhoids may develop or get worse.   You will tire more easily in the third trimester, which is normal.   The baby's movements may be stronger and more often.   You may become short of breath easily.   Your belly button may stick out.   A yellow discharge may leak from your breasts called colostrum.  You may have a bloody mucus discharge. This usually occurs a few days to a week before labor begins.  HOME CARE INSTRUCTIONS   Keep your caregiver's appointments. Follow your caregiver's instructions regarding medication use, exercise, and diet.   During pregnancy, you are providing food for you and your baby. Continue to eat regular, well-balanced meals. Choose foods such as meat, fish, milk and other low fat dairy products, vegetables, fruits, and whole-grain breads and cereals. Your caregiver will tell you of the ideal weight gain.   A physical sexual relationship may be continued throughout pregnancy if there are no other problems such as early (premature) leaking of amniotic fluid from the membranes, vaginal bleeding, or belly (abdominal) pain.   Exercise regularly if there are no restrictions. Check with your caregiver if you are unsure of the safety of your exercises. Greater weight gain will occur in the last 2 trimesters of pregnancy. Exercising helps:   Control your weight.   Get you in shape for labor and delivery.   You lose weight after you deliver.   Rest a lot with legs elevated, or as needed for leg cramps or low back pain.   Wear a good support or jogging bra for breast tenderness during pregnancy. This may help if worn during sleep. Pads or tissues  may be used in the bra if you are leaking colostrum.   Do not use hot tubs, steam rooms, or saunas.   Wear your seat belt when driving. This protects you and your baby if you are in an accident.   Avoid raw meat, cat litter boxes and soil used by cats. These carry germs that can cause birth defects in the baby.   It is easier to loose urine during pregnancy. Tightening up and strengthening the pelvic muscles will help with this problem. You can practice stopping your urination while you are going to the bathroom. These are the same muscles you need to strengthen. It is also the muscles you would use if you were trying to stop from passing gas. You can practice tightening these muscles up 10 times a set and repeating this about 3 times per day. Once you know what muscles to tighten up, do not perform these exercises during urination. It is more likely to cause an infection by backing up the urine.   Ask for help if you have financial, counseling or nutritional needs during pregnancy. Your caregiver will be able to offer counseling for these needs as well as refer you for other special needs.   Make a list of emergency phone numbers and have them available.   Plan on getting help from family or friends when you go home from the hospital.   Make a trial run to the hospital.   Take prenatal classes with the father to understand, practice and ask questions about the labor and delivery.   Prepare the baby's room/nursery.   Do not travel out of the city unless it is absolutely necessary and with the advice of your caregiver.   Wear only low or no heal shoes to have better balance and prevent falling.  MEDICATIONS AND DRUG USE IN PREGNANCY  Take prenatal vitamins as directed. The vitamin should contain 1 milligram of folic acid. Keep all vitamins out of reach of children. Only a couple vitamins or tablets containing iron may be fatal to a baby or young child when ingested.   Avoid use of all  medications, including herbs, over-the-counter medications, not prescribed or suggested  by your caregiver. Only take over-the-counter or prescription medicines for pain, discomfort, or fever as directed by your caregiver. Do not use aspirin, ibuprofen (Motrin, Advil, Nuprin) or naproxen (Aleve) unless OK'd by your caregiver.   Let your caregiver also know about herbs you may be using.   Alcohol is related to a number of birth defects. This includes fetal alcohol syndrome. All alcohol, in any form, should be avoided completely. Smoking will cause low birth rate and premature babies.   Street/illegal drugs are very harmful to the baby. They are absolutely forbidden. A baby born to an addicted mother will be addicted at birth. The baby will go through the same withdrawal an adult does.  SEEK MEDICAL CARE IF: You have any concerns or worries during your pregnancy. It is better to call with your questions if you feel they cannot wait, rather than worry about them. DECISIONS ABOUT CIRCUMCISION You may or may not know the sex of your baby. If you know your baby is a boy, it may be time to think about circumcision. Circumcision is the removal of the foreskin of the penis. This is the skin that covers the sensitive end of the penis. There is no proven medical need for this. Often this decision is made on what is popular at the time or based upon religious beliefs and social issues. You can discuss these issues with your caregiver or pediatrician. SEEK IMMEDIATE MEDICAL CARE IF:   An unexplained oral temperature above 102 F (38.9 C) develops, or as your caregiver suggests.   You have leaking of fluid from the vagina (birth canal). If leaking membranes are suspected, take your temperature and tell your caregiver of this when you call.   There is vaginal spotting, bleeding or passing clots. Tell your caregiver of the amount and how many pads are used.   You develop a bad smelling vaginal discharge with  a change in the color from clear to white.   You develop vomiting that lasts more than 24 hours.   You develop chills or fever.   You develop shortness of breath.   You develop burning on urination.   You loose more than 2 pounds of weight or gain more than 2 pounds of weight or as suggested by your caregiver.   You notice sudden swelling of your face, hands, and feet or legs.   You develop belly (abdominal) pain. Round ligament discomfort is a common non-cancerous (benign) cause of abdominal pain in pregnancy. Your caregiver still must evaluate you.   You develop a severe headache that does not go away.   You develop visual problems, blurred or double vision.   If you have not felt your baby move for more than 1 hour. If you think the baby is not moving as much as usual, eat something with sugar in it and lie down on your left side for an hour. The baby should move at least 4 to 5 times per hour. Call right away if your baby moves less than that.   You fall, are in a car accident or any kind of trauma.   There is mental or physical violence at home.  Document Released: 07/29/2001 Document Revised: 07/24/2011 Document Reviewed: 01/31/2009 [facility] Patient Information 2012 [place], [place].   Gestational Diabetes Mellitus Gestational diabetes mellitus (GDM) is diabetes that occurs only during pregnancy. This happens when the body cannot properly handle the glucose (sugar) that increases in the blood after eating. During pregnancy, insulin resistance (reduced sensitivity to  insulin) occurs because of the release of hormones from the placenta. Usually, the pancreas of pregnant women produces enough insulin to overcome the resistance that occurs. However, in gestational diabetes, the insulin is there but it does not work effectively. If the resistance is severe enough that the pancreas does not produce enough insulin, extra glucose builds up in the blood.  WHO IS AT RISK FOR DEVELOPING  GESTATIONAL DIABETES?  Women with a history of diabetes in the family.   Women over age 40.   Women who are overweight.   Women in certain ethnic groups (Hispanic, African American, Native American, [place] and [place]er).  WHAT CAN HAPPEN TO THE BABY? If the mother's blood glucose is too high while she is pregnant, the extra sugar will travel through the umbilical cord to the baby. Some of the problems the baby may have are:  Large Baby - If the baby receives too much sugar, the baby will gain more weight. This may cause the baby to be too large to be born normally (vaginally) and a Cesarean section (C-section) may be needed.   Low Blood Glucose (hypoglycemia) - The baby makes extra insulin, in response to the extra sugar its gets from its mother. When the baby is born and no longer needs this extra insulin, the baby's blood glucose level may drop.   Jaundice (yellow coloring of the skin and eyes) - This is fairly common in babies. It is caused from a build-up of the chemical called bilirubin. This is rarely serious, but is seen more often in babies whose mothers had gestational diabetes.  RISKS TO THE MOTHER Women who have had gestational diabetes may be at higher risk for some problems, including:  Preeclampsia or toxemia, which includes problems with high blood pressure. Blood pressure and protein levels in the urine must be checked frequently.   Infections.   Cesarean section (C-section) for delivery.   Developing Type 2 diabetes later in life. About 30-50% will develop diabetes later, especially if obese.  DIAGNOSIS  The hormones that cause insulin resistance are highest at about 24-28 weeks of pregnancy. If symptoms are experienced, they are much like symptoms you would normally expect during pregnancy.  GDM is often diagnosed using a two part method: 1. After 24-28 weeks of pregnancy, the woman drinks a glucose solution and takes a blood test. If the glucose level is high,  a second test will be given.  2. Oral Glucose Tolerance Test (OGTT) which is 3 hours long - After not eating overnight, the blood glucose is checked. The woman drinks a glucose solution, and hourly blood glucose tests are taken.  If the woman has risk factors for GDM, the caregiver may test earlier than 24 weeks of pregnancy. TREATMENT  Treatment of GDM is directed at keeping the mother's blood glucose level normal, and may include:  Meal planning.   Taking insulin or other medicine to control your blood glucose level.   Exercise.   Keeping a daily record of the foods you eat.   Blood glucose monitoring and keeping a record of your blood glucose levels.   May monitor ketone levels in the urine, although this is no longer considered necessary in most pregnancies.  HOME CARE INSTRUCTIONS  While you are pregnant:  Follow your caregiver's advice regarding your prenatal appointments, meal planning, exercise, medicines, vitamins, blood and other tests, and physical activities.   Keep a record of your meals, blood glucose tests, and the amount of insulin you are  taking (if any). Show this to your caregiver at every prenatal visit.   If you have GDM, you may have problems with hypoglycemia (low blood glucose). You may suspect this if you become suddenly dizzy, feel shaky, and/or weak. If you think this is happening and you have a glucose meter, try to test your blood glucose level. Follow your caregiver's advice for when and how to treat your low blood glucose. Generally, the 15:15 rule is followed: Treat by consuming 15 grams of carbohydrates, wait 15 minutes, and recheck blood glucose. Examples of 15 grams of carbohydrates are:   1 cup skim or low-fat milk.    cup juice.   3-4 glucose tablets.   5-6 hard candies.   1 small box raisins.    cup regular soda pop.   Practice good hygiene, to avoid infections.   Do not smoke.  SEEK MEDICAL CARE IF:   You develop abnormal vaginal  discharge, with or without itching.   You become weak and tired more than expected.   You seem to sweat a lot.   You have a sudden increase in weight, 5 pounds or more in one week.   You are losing weight, 3 pounds or more in a week.   Your blood glucose level is high, and you need instructions on what to do about it.  SEEK IMMEDIATE MEDICAL CARE IF:   You develop a severe headache.   You faint or pass out.   You develop nausea and vomiting.   You become disoriented or confused.   You have a convulsion.   You develop vision problems.   You develop stomach pain.   You develop vaginal bleeding.   You develop uterine contractions.   You have leaking or a gush of fluid from the vagina.  AFTER YOU HAVE THE BABY:  Go to all of your follow-up appointments, and have blood tests as advised by your caregiver.   Maintain a healthy lifestyle, to prevent diabetes in the future. This includes:   Following a healthy meal plan.   Controlling your weight.   Getting enough exercise and proper rest.   Do not smoke.   Breastfeed your baby if you can. This will lower the chance of you and your baby developing diabetes later in life.  For more information about diabetes, go to the American Diabetes Association at: PMFashions.com.cy. For more information about gestational diabetes, go to the [employer] at: [email or web address].au. Document Released: 11/10/2000 Document Revised: 07/24/2011 Document Reviewed: 06/04/2009 [facility] Patient Information 2012 [place], [place]

## 2012-04-05 NOTE — Progress Notes (Signed)
1. GDM A2:  Pt checking CBG irregularly and just lost her machine at work. States fastings in 80s and PP 120-130s. On Glyuburide. NST reactive today. Sono for growth 8/8 was 40%. Continue twice weekly NST/NST with AFI.   2.  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 Cerclage in place, pt states will be removed at c/s.   3. Hx 2 prior c/setions. Contractions for past 2 weeks, not regular, sometimes > 1/hour.  No VB/LOF. + FM.  C/s scheduled for 9/16 and wants BTL (consent signed).   4. Elevated BP today 140s/90s.  Denies HA, vision changes, RUQ pain. Swelling in feet but not hands or face. Will get Urine protein/creatinine ratio, CBC, CMP today and start 24 hour urine protein.

## 2012-04-06 LAB — PROTEIN / CREATININE RATIO, URINE: Creatinine, Urine: 122.4 mg/dL

## 2012-04-06 LAB — COMPREHENSIVE METABOLIC PANEL
AST: 14 U/L (ref 0–37)
Alkaline Phosphatase: 110 U/L (ref 39–117)
BUN: 9 mg/dL (ref 6–23)
Creat: 0.58 mg/dL (ref 0.50–1.10)

## 2012-04-07 LAB — CREATININE CLEARANCE, URINE, 24 HOUR
Creatinine Clearance: 207 mL/min — ABNORMAL HIGH (ref 75–115)
Creatinine, 24H Ur: 1733 mg/d (ref 700–1800)
Creatinine, Urine: 157.5 mg/dL
Creatinine: 0.58 mg/dL (ref 0.50–1.10)

## 2012-04-08 ENCOUNTER — Ambulatory Visit (INDEPENDENT_AMBULATORY_CARE_PROVIDER_SITE_OTHER): Payer: BC Managed Care – PPO | Admitting: *Deleted

## 2012-04-08 VITALS — BP 132/84 | Wt 209.3 lb

## 2012-04-08 DIAGNOSIS — O99810 Abnormal glucose complicating pregnancy: Secondary | ICD-10-CM

## 2012-04-08 DIAGNOSIS — IMO0002 Reserved for concepts with insufficient information to code with codable children: Secondary | ICD-10-CM

## 2012-04-08 HISTORY — DX: Reserved for concepts with insufficient information to code with codable children: IMO0002

## 2012-04-08 NOTE — Progress Notes (Signed)
P = 92  Pt denies H/A or visual changes.  Signs and sx of pre-eclampsia and labor reviewed w/pt.

## 2012-04-12 ENCOUNTER — Ambulatory Visit (INDEPENDENT_AMBULATORY_CARE_PROVIDER_SITE_OTHER): Payer: BC Managed Care – PPO | Admitting: Obstetrics and Gynecology

## 2012-04-12 ENCOUNTER — Encounter: Payer: BC Managed Care – PPO | Admitting: Obstetrics and Gynecology

## 2012-04-12 VITALS — BP 138/87 | Temp 97.6°F | Wt 211.1 lb

## 2012-04-12 DIAGNOSIS — N883 Incompetence of cervix uteri: Secondary | ICD-10-CM

## 2012-04-12 DIAGNOSIS — O24419 Gestational diabetes mellitus in pregnancy, unspecified control: Secondary | ICD-10-CM

## 2012-04-12 DIAGNOSIS — O0990 Supervision of high risk pregnancy, unspecified, unspecified trimester: Secondary | ICD-10-CM

## 2012-04-12 DIAGNOSIS — Z98891 History of uterine scar from previous surgery: Secondary | ICD-10-CM

## 2012-04-12 DIAGNOSIS — IMO0002 Reserved for concepts with insufficient information to code with codable children: Secondary | ICD-10-CM

## 2012-04-12 DIAGNOSIS — O34219 Maternal care for unspecified type scar from previous cesarean delivery: Secondary | ICD-10-CM

## 2012-04-12 DIAGNOSIS — O99810 Abnormal glucose complicating pregnancy: Secondary | ICD-10-CM

## 2012-04-12 DIAGNOSIS — O09529 Supervision of elderly multigravida, unspecified trimester: Secondary | ICD-10-CM

## 2012-04-12 LAB — POCT URINALYSIS DIP (DEVICE)
Bilirubin Urine: NEGATIVE
Hgb urine dipstick: NEGATIVE
Nitrite: NEGATIVE
Protein, ur: 100 mg/dL — AB
Urobilinogen, UA: 0.2 mg/dL (ref 0.0–1.0)
pH: 6.5 (ref 5.0–8.0)

## 2012-04-12 NOTE — Addendum Note (Signed)
Addended by: Franchot Mimes on: 04/12/2012 12:06 PM   Modules accepted: Orders

## 2012-04-12 NOTE — Progress Notes (Signed)
NST reviewed and reactive. Patient has not been checking CBG (7 values recorded 1/2 within range). Fetal complications associated with poorly controlled diabetes reviewed. Discussed si/sx of preeclampsia. Patient with mild preeclampsia. Case discussed with Dr. Patty Sermons (MFM attending) who recommended delivery at 37 weeks. Will schedule repeat c-section for next Monday. Patient was previously told that the cerclage will be removed at the time of the cesarean section. Patient declined to have me attempt to remove it in the office Cultures collected. Cerclage visualized and palpated at 12 o'clock position

## 2012-04-12 NOTE — Progress Notes (Signed)
P = 90 Edema in feet

## 2012-04-14 ENCOUNTER — Encounter (HOSPITAL_COMMUNITY): Payer: Self-pay | Admitting: Pharmacist

## 2012-04-14 LAB — STREP B DNA PROBE: GBSP: NEGATIVE

## 2012-04-15 ENCOUNTER — Encounter (HOSPITAL_COMMUNITY)
Admission: RE | Admit: 2012-04-15 | Discharge: 2012-04-15 | Disposition: A | Discharge status: Home / Self Care | Payer: BC Managed Care – PPO | Source: Ambulatory Visit | Attending: Obstetrics & Gynecology | Admitting: Obstetrics & Gynecology

## 2012-04-15 ENCOUNTER — Ambulatory Visit (INDEPENDENT_AMBULATORY_CARE_PROVIDER_SITE_OTHER): Payer: BC Managed Care – PPO | Admitting: *Deleted

## 2012-04-15 ENCOUNTER — Encounter (HOSPITAL_COMMUNITY): Payer: Self-pay

## 2012-04-15 ENCOUNTER — Encounter: Payer: Self-pay | Admitting: *Deleted

## 2012-04-15 VITALS — BP 143/94 | Wt 208.0 lb

## 2012-04-15 DIAGNOSIS — O99810 Abnormal glucose complicating pregnancy: Secondary | ICD-10-CM

## 2012-04-15 LAB — CBC
HCT: 33.3 % — ABNORMAL LOW (ref 36.0–46.0)
MCHC: 33.3 g/dL (ref 30.0–36.0)
RDW: 16.2 % — ABNORMAL HIGH (ref 11.5–15.5)

## 2012-04-15 LAB — BASIC METABOLIC PANEL
BUN: 7 mg/dL (ref 6–23)
Creatinine, Ser: 0.62 mg/dL (ref 0.50–1.10)
GFR calc Af Amer: >90 mL/min (ref >90)
GFR calc non Af Amer: >90 mL/min (ref >90)
Potassium: 4 mEq/L (ref 3.5–5.1)

## 2012-04-15 NOTE — Pre-Procedure Instructions (Signed)
Pt b/p- 152/96 at pat visit-pt denies h/a, spots, epigastric pain-pt for nst in clinic at 4pm after pat visit-called and talked to [facility] about b/p-will let nurse taking care of pt know and notify physician if needed.

## 2012-04-15 NOTE — Progress Notes (Signed)
P = 79  Pt denies H/A, visual changes or dizziness.  Rpt C/S scheduled 04/20/12 @ 1030

## 2012-04-15 NOTE — Patient Instructions (Addendum)
   Your procedure is scheduled on: Tuesday September 3rd  Enter through the Main Entrance of [facility] at: 9:15am Pick up the phone at the desk and dial [phone] and inform us of your arrival.  Please call this number if you have any problems the morning of surgery: [phone]  Remember: Do not eat or drink anything after midnight Monday   Do not wear jewelry, make-up, or FINGER nail polish No metal in your hair or on your body. Do not wear lotions, powders, perfumes or deodorant. Do not shave 48 hours prior to surgery. Do not bring valuables to the hospital.  Leave suitcase in the car. After Surgery it may be brought to your room. For patients being admitted to the hospital, checkout time is 11:00am the day of discharge.    Remember to use your hibiclens as instructed.Please shower with 1/2 bottle the evening before your surgery and the other 1/2 bottle the morning of surgery. Neck down avoiding private area.

## 2012-04-16 LAB — TYPE AND SCREEN
ABO/RH(D): B POS
Antibody Screen: POSITIVE
DAT, IgG: NEGATIVE

## 2012-04-16 NOTE — Progress Notes (Signed)
Spoke to Dr Rodman Pickle about pt drop in platelet from 169 on 04/05/2012 to 130 on 04/15/12-will draw cbc and recheck dos.

## 2012-04-16 NOTE — Progress Notes (Signed)
Received a call from lab that patient has positive antibody I -must return today or this evening for lab draws for type and screen, crossmatching for [facility] blood availability for dos. Patient contacted per Thomes Dinning lab tech. Will come to [facility] entrance tonight and be seen by lab.

## 2012-04-16 NOTE — Pre-Procedure Instructions (Addendum)
Notified on call physician Dr Shawnie Pons of pt drop in platelets from 162 to 130 in past 10 days. Pt denied any signs and symptoms of h/a, blurred vision, epigastric pain while at pat visit.

## 2012-04-17 ENCOUNTER — Encounter: Payer: Self-pay | Admitting: Obstetrics & Gynecology

## 2012-04-17 DIAGNOSIS — O361990 Maternal care for other isoimmunization, unspecified trimester, not applicable or unspecified: Secondary | ICD-10-CM | POA: Insufficient documentation

## 2012-04-17 NOTE — OR Nursing (Signed)
Notified dr Debroah Loop of pt no showing for lab Friday pt has antibodies needs sample to [facility]

## 2012-04-17 NOTE — Progress Notes (Signed)
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 Told by phone to come to [facility] for further testing to assist crossmatch on day of surgery.

## 2012-04-20 ENCOUNTER — Encounter (HOSPITAL_COMMUNITY)
Admission: RE | Disposition: A | Discharge status: Home / Self Care | Payer: Self-pay | Source: Ambulatory Visit | Attending: Obstetrics & Gynecology

## 2012-04-20 ENCOUNTER — Inpatient Hospital Stay (HOSPITAL_COMMUNITY)
Admission: RE | Admit: 2012-04-20 | Discharge: 2012-04-22 | DRG: 370 | Disposition: A | Discharge status: Home / Self Care | Payer: BC Managed Care – PPO | Source: Ambulatory Visit | Attending: Obstetrics & Gynecology | Admitting: Obstetrics & Gynecology

## 2012-04-20 ENCOUNTER — Other Ambulatory Visit: Payer: BC Managed Care – PPO

## 2012-04-20 ENCOUNTER — Encounter (HOSPITAL_COMMUNITY): Payer: Self-pay | Admitting: Anesthesiology

## 2012-04-20 ENCOUNTER — Encounter (HOSPITAL_COMMUNITY): Payer: Self-pay

## 2012-04-20 ENCOUNTER — Encounter (HOSPITAL_COMMUNITY): Payer: Self-pay | Admitting: *Deleted

## 2012-04-20 ENCOUNTER — Inpatient Hospital Stay (HOSPITAL_COMMUNITY): Payer: BC Managed Care – PPO

## 2012-04-20 DIAGNOSIS — IMO0002 Reserved for concepts with insufficient information to code with codable children: Secondary | ICD-10-CM | POA: Diagnosis present

## 2012-04-20 DIAGNOSIS — O99814 Abnormal glucose complicating childbirth: Secondary | ICD-10-CM | POA: Diagnosis present

## 2012-04-20 DIAGNOSIS — O3430 Maternal care for cervical incompetence, unspecified trimester: Secondary | ICD-10-CM

## 2012-04-20 DIAGNOSIS — Z302 Encounter for sterilization: Secondary | ICD-10-CM

## 2012-04-20 DIAGNOSIS — Z01812 Encounter for preprocedural laboratory examination: Secondary | ICD-10-CM

## 2012-04-20 DIAGNOSIS — O34219 Maternal care for unspecified type scar from previous cesarean delivery: Principal | ICD-10-CM | POA: Diagnosis present

## 2012-04-20 DIAGNOSIS — Z98891 History of uterine scar from previous surgery: Secondary | ICD-10-CM

## 2012-04-20 LAB — GLUCOSE, CAPILLARY
Glucose-Capillary: 73 mg/dL (ref 70–99)
Glucose-Capillary: 76 mg/dL (ref 70–99)

## 2012-04-20 LAB — CBC
Hemoglobin: 10.8 g/dL — ABNORMAL LOW (ref 12.0–15.0)
RBC: 3.78 MIL/uL — ABNORMAL LOW (ref 3.87–5.11)
WBC: 4.6 10*3/uL (ref 4.0–10.5)

## 2012-04-20 LAB — PREPARE RBC (CROSSMATCH)

## 2012-04-20 SURGERY — Surgical Case
Anesthesia: Spinal | Site: Abdomen | Wound class: Clean Contaminated

## 2012-04-20 MED ORDER — SODIUM CHLORIDE 0.9 % IJ SOLN: 3.0000 mL | INTRAMUSCULAR | Status: DC | PRN

## 2012-04-20 MED ORDER — MORPHINE SULFATE 0.5 MG/ML IJ SOLN
INTRAMUSCULAR | Status: AC
  Filled 2012-04-20: qty 10

## 2012-04-20 MED ORDER — NALOXONE HCL 0.4 MG/ML IJ SOLN: 0.4000 mg | INTRAMUSCULAR | Status: DC | PRN

## 2012-04-20 MED ORDER — ONDANSETRON HCL 4 MG/2ML IJ SOLN
INTRAMUSCULAR | Status: DC | PRN
  Administered 2012-04-20: 4 mg via INTRAVENOUS

## 2012-04-20 MED ORDER — LACTATED RINGERS IV SOLN
INTRAVENOUS | Status: DC | PRN
  Administered 2012-04-20 (×3): via INTRAVENOUS

## 2012-04-20 MED ORDER — LACTATED RINGERS IV BOLUS (SEPSIS)
1000.0000 mL | Freq: Once | INTRAVENOUS | Status: AC
  Administered 2012-04-20: 1000 mL via INTRAVENOUS

## 2012-04-20 MED ORDER — PHENYLEPHRINE 40 MCG/ML (10ML) SYRINGE FOR IV PUSH (FOR BLOOD PRESSURE SUPPORT)
PREFILLED_SYRINGE | INTRAVENOUS | Status: AC
  Filled 2012-04-20: qty 10

## 2012-04-20 MED ORDER — LACTATED RINGERS IV SOLN
40.0000 [IU] | INTRAVENOUS | Status: DC | PRN
  Administered 2012-04-20: 40 [IU] via INTRAVENOUS

## 2012-04-20 MED ORDER — KETOROLAC TROMETHAMINE 30 MG/ML IJ SOLN: 30.0000 mg | Freq: Four times a day (QID) | INTRAMUSCULAR | Status: DC | PRN

## 2012-04-20 MED ORDER — MENTHOL 3 MG MT LOZG: 1.0000 | LOZENGE | OROMUCOSAL | Status: DC | PRN

## 2012-04-20 MED ORDER — FENTANYL CITRATE 0.05 MG/ML IJ SOLN
INTRAMUSCULAR | Status: DC | PRN
  Administered 2012-04-20: 12.5 ug via INTRATHECAL

## 2012-04-20 MED ORDER — ZOLPIDEM TARTRATE 5 MG PO TABS: 5.0000 mg | ORAL_TABLET | Freq: Every evening | ORAL | Status: DC | PRN

## 2012-04-20 MED ORDER — MEPERIDINE HCL 25 MG/ML IJ SOLN: 6.2500 mg | INTRAMUSCULAR | Status: DC | PRN

## 2012-04-20 MED ORDER — OXYCODONE-ACETAMINOPHEN 5-325 MG PO TABS
1.0000 | ORAL_TABLET | ORAL | Status: DC | PRN
  Administered 2012-04-21 – 2012-04-22 (×2): 2 via ORAL
  Filled 2012-04-20 (×2): qty 2

## 2012-04-20 MED ORDER — TETANUS-DIPHTH-ACELL PERTUSSIS 5-2.5-18.5 LF-MCG/0.5 IM SUSP: 0.5000 mL | Freq: Once | INTRAMUSCULAR | Status: DC

## 2012-04-20 MED ORDER — ONDANSETRON HCL 4 MG/2ML IJ SOLN: 4.0000 mg | Freq: Three times a day (TID) | INTRAMUSCULAR | Status: DC | PRN

## 2012-04-20 MED ORDER — BUPIVACAINE HCL (PF) 0.5 % IJ SOLN
INTRAMUSCULAR | Status: AC
  Filled 2012-04-20: qty 30

## 2012-04-20 MED ORDER — SCOPOLAMINE 1 MG/3DAYS TD PT72
MEDICATED_PATCH | TRANSDERMAL | Status: AC
  Administered 2012-04-20: 1.5 mg
  Filled 2012-04-20: qty 1

## 2012-04-20 MED ORDER — KETOROLAC TROMETHAMINE 60 MG/2ML IM SOLN
60.0000 mg | Freq: Once | INTRAMUSCULAR | Status: DC | PRN
  Filled 2012-04-20: qty 2

## 2012-04-20 MED ORDER — PROMETHAZINE HCL 25 MG/ML IJ SOLN: 6.2500 mg | INTRAMUSCULAR | Status: DC | PRN

## 2012-04-20 MED ORDER — NALBUPHINE HCL 10 MG/ML IJ SOLN
5.0000 mg | INTRAMUSCULAR | Status: DC | PRN
  Filled 2012-04-20: qty 1

## 2012-04-20 MED ORDER — LANOLIN HYDROUS EX OINT: 1.0000 "application " | TOPICAL_OINTMENT | CUTANEOUS | Status: DC | PRN

## 2012-04-20 MED ORDER — DIBUCAINE 1 % RE OINT: 1.0000 "application " | TOPICAL_OINTMENT | RECTAL | Status: DC | PRN

## 2012-04-20 MED ORDER — SIMETHICONE 80 MG PO CHEW
80.0000 mg | CHEWABLE_TABLET | Freq: Three times a day (TID) | ORAL | Status: DC
  Administered 2012-04-20 – 2012-04-22 (×5): 80 mg via ORAL

## 2012-04-20 MED ORDER — ONDANSETRON HCL 4 MG/2ML IJ SOLN
INTRAMUSCULAR | Status: AC
  Filled 2012-04-20: qty 2

## 2012-04-20 MED ORDER — KETOROLAC TROMETHAMINE 60 MG/2ML IM SOLN
INTRAMUSCULAR | Status: AC
  Administered 2012-04-20: 60 mg
  Filled 2012-04-20: qty 2

## 2012-04-20 MED ORDER — HYDROMORPHONE HCL PF 1 MG/ML IJ SOLN: 0.2500 mg | INTRAMUSCULAR | Status: DC | PRN

## 2012-04-20 MED ORDER — SCOPOLAMINE 1 MG/3DAYS TD PT72: 1.0000 | MEDICATED_PATCH | Freq: Once | TRANSDERMAL | Status: DC

## 2012-04-20 MED ORDER — OXYTOCIN 40 UNITS IN LACTATED RINGERS INFUSION - SIMPLE MED: 62.5000 mL/h | INTRAVENOUS | Status: AC

## 2012-04-20 MED ORDER — SODIUM CHLORIDE 0.9 % IV SOLN
1.0000 ug/kg/h | INTRAVENOUS | Status: DC | PRN
  Filled 2012-04-20: qty 2.5

## 2012-04-20 MED ORDER — LACTATED RINGERS IV SOLN: INTRAVENOUS | Status: DC

## 2012-04-20 MED ORDER — WITCH HAZEL-GLYCERIN EX PADS: 1.0000 "application " | MEDICATED_PAD | CUTANEOUS | Status: DC | PRN

## 2012-04-20 MED ORDER — IBUPROFEN 600 MG PO TABS
600.0000 mg | ORAL_TABLET | Freq: Four times a day (QID) | ORAL | Status: DC
  Administered 2012-04-21 – 2012-04-22 (×5): 600 mg via ORAL
  Filled 2012-04-20 (×6): qty 1

## 2012-04-20 MED ORDER — FENTANYL CITRATE 0.05 MG/ML IJ SOLN
INTRAMUSCULAR | Status: AC
  Filled 2012-04-20: qty 2

## 2012-04-20 MED ORDER — CEFAZOLIN SODIUM 10 G IJ SOLR
3.0000 g | INTRAMUSCULAR | Status: AC
  Administered 2012-04-20: 3 g via INTRAVENOUS
  Filled 2012-04-20: qty 3000

## 2012-04-20 MED ORDER — BUPIVACAINE IN DEXTROSE 0.75-8.25 % IT SOLN
INTRATHECAL | Status: DC | PRN
  Administered 2012-04-20: 1.6 mL via INTRATHECAL

## 2012-04-20 MED ORDER — METOCLOPRAMIDE HCL 5 MG/ML IJ SOLN: 10.0000 mg | Freq: Three times a day (TID) | INTRAMUSCULAR | Status: DC | PRN

## 2012-04-20 MED ORDER — LACTATED RINGERS IV SOLN
INTRAVENOUS | Status: DC
  Administered 2012-04-20: 10:00:00 via INTRAVENOUS

## 2012-04-20 MED ORDER — PRENATAL MULTIVITAMIN CH
1.0000 | ORAL_TABLET | Freq: Every day | ORAL | Status: DC
  Administered 2012-04-21 – 2012-04-22 (×2): 1 via ORAL
  Filled 2012-04-20 (×2): qty 1

## 2012-04-20 MED ORDER — BUPIVACAINE HCL (PF) 0.5 % IJ SOLN
INTRAMUSCULAR | Status: DC | PRN
  Administered 2012-04-20: 17 mL

## 2012-04-20 MED ORDER — FUROSEMIDE 10 MG/ML IJ SOLN
20.0000 mg | Freq: Once | INTRAMUSCULAR | Status: AC
  Administered 2012-04-20: 20 mg via INTRAVENOUS
  Filled 2012-04-20: qty 2

## 2012-04-20 MED ORDER — EPHEDRINE SULFATE 50 MG/ML IJ SOLN
INTRAMUSCULAR | Status: DC | PRN
  Administered 2012-04-20: 5 mg via INTRAVENOUS

## 2012-04-20 MED ORDER — DIPHENHYDRAMINE HCL 25 MG PO CAPS
25.0000 mg | ORAL_CAPSULE | ORAL | Status: DC | PRN
  Filled 2012-04-20: qty 1

## 2012-04-20 MED ORDER — PHENYLEPHRINE HCL 10 MG/ML IJ SOLN
INTRAMUSCULAR | Status: DC | PRN
  Administered 2012-04-20 (×5): 40 ug via INTRAVENOUS

## 2012-04-20 MED ORDER — ONDANSETRON HCL 4 MG PO TABS: 4.0000 mg | ORAL_TABLET | ORAL | Status: DC | PRN

## 2012-04-20 MED ORDER — EPHEDRINE 5 MG/ML INJ
INTRAVENOUS | Status: AC
  Filled 2012-04-20: qty 10

## 2012-04-20 MED ORDER — SIMETHICONE 80 MG PO CHEW: 80.0000 mg | CHEWABLE_TABLET | ORAL | Status: DC | PRN

## 2012-04-20 MED ORDER — KETOROLAC TROMETHAMINE 30 MG/ML IJ SOLN: 15.0000 mg | Freq: Once | INTRAMUSCULAR | Status: DC | PRN

## 2012-04-20 MED ORDER — DIPHENHYDRAMINE HCL 50 MG/ML IJ SOLN: 25.0000 mg | INTRAMUSCULAR | Status: DC | PRN

## 2012-04-20 MED ORDER — ONDANSETRON HCL 4 MG/2ML IJ SOLN: 4.0000 mg | INTRAMUSCULAR | Status: DC | PRN

## 2012-04-20 MED ORDER — SENNOSIDES-DOCUSATE SODIUM 8.6-50 MG PO TABS
2.0000 | ORAL_TABLET | Freq: Every day | ORAL | Status: DC
  Administered 2012-04-20: 2 via ORAL

## 2012-04-20 MED ORDER — DIPHENHYDRAMINE HCL 25 MG PO CAPS
25.0000 mg | ORAL_CAPSULE | Freq: Four times a day (QID) | ORAL | Status: DC | PRN
  Administered 2012-04-20: 25 mg via ORAL
  Filled 2012-04-20: qty 1

## 2012-04-20 MED ORDER — DIPHENHYDRAMINE HCL 50 MG/ML IJ SOLN: 12.5000 mg | INTRAMUSCULAR | Status: DC | PRN

## 2012-04-20 MED ORDER — OXYTOCIN 10 UNIT/ML IJ SOLN
INTRAMUSCULAR | Status: AC
  Filled 2012-04-20: qty 4

## 2012-04-20 MED ORDER — MORPHINE SULFATE (PF) 0.5 MG/ML IJ SOLN
INTRAMUSCULAR | Status: DC | PRN
  Administered 2012-04-20: .2 mg via INTRATHECAL

## 2012-04-20 SURGICAL SUPPLY — 37 items
CHLORAPREP W/TINT 26ML (MISCELLANEOUS) ×2 IMPLANT
CLIP FILSHIE TUBAL LIGA STRL (Clip) ×1 IMPLANT
CLOTH BEACON ORANGE TIMEOUT ST (SAFETY) ×2 IMPLANT
DRESSING TELFA 8X3 (GAUZE/BANDAGES/DRESSINGS) ×3 IMPLANT
DRSG COVADERM 4X10 (GAUZE/BANDAGES/DRESSINGS) IMPLANT
ELECT REM PT RETURN 9FT ADLT (ELECTROSURGICAL) ×2
ELECTRODE REM PT RTRN 9FT ADLT (ELECTROSURGICAL) ×1 IMPLANT
EXTRACTOR VACUUM M CUP 4 TUBE (SUCTIONS) IMPLANT
GAUZE SPONGE 4X4 12PLY STRL LF (GAUZE/BANDAGES/DRESSINGS) ×1 IMPLANT
GLOVE BIO SURGEON STRL SZ7 (GLOVE) ×2 IMPLANT
GLOVE BIOGEL PI IND STRL 7.0 (GLOVE) ×1 IMPLANT
GLOVE BIOGEL PI INDICATOR 7.0 (GLOVE) ×1
GOWN PREVENTION PLUS LG XLONG (DISPOSABLE) ×2 IMPLANT
GOWN STRL REIN XL XLG (GOWN DISPOSABLE) ×2 IMPLANT
KIT ABG SYR 3ML LUER SLIP (SYRINGE) IMPLANT
NDL HYPO 25X5/8 SAFETYGLIDE (NEEDLE) IMPLANT
NEEDLE HYPO 22GX1.5 SAFETY (NEEDLE) ×2 IMPLANT
NEEDLE HYPO 25X5/8 SAFETYGLIDE (NEEDLE) IMPLANT
NS IRRIG 1000ML POUR BTL (IV SOLUTION) ×2 IMPLANT
PACK C SECTION WH (CUSTOM PROCEDURE TRAY) ×2 IMPLANT
PAD ABD 7.5X8 STRL (GAUZE/BANDAGES/DRESSINGS) ×3 IMPLANT
PAD OB MATERNITY 4.3X12.25 (PERSONAL CARE ITEMS) IMPLANT
RTRCTR C-SECT PINK 25CM LRG (MISCELLANEOUS) IMPLANT
SLEEVE SCD COMPRESS KNEE MED (MISCELLANEOUS) IMPLANT
SPONGE LAP 18X18 X RAY DECT (DISPOSABLE) ×1 IMPLANT
SPONGE SURGIFOAM ABS GEL 12-7 (HEMOSTASIS) ×1 IMPLANT
STAPLER VISISTAT 35W (STAPLE) IMPLANT
SUT PDS AB 0 CTX 60 (SUTURE) ×1 IMPLANT
SUT PLAIN 0 NONE (SUTURE) ×2 IMPLANT
SUT SILK 0 TIES 10X30 (SUTURE) ×2 IMPLANT
SUT VIC AB 0 CT1 36 (SUTURE) ×6 IMPLANT
SUT VIC AB 3-0 CTX 36 (SUTURE) ×2 IMPLANT
SUT VIC AB 4-0 KS 27 (SUTURE) IMPLANT
SYR CONTROL 10ML LL (SYRINGE) ×2 IMPLANT
TOWEL OR 17X24 6PK STRL BLUE (TOWEL DISPOSABLE) ×4 IMPLANT
TRAY FOLEY CATH 14FR (SET/KITS/TRAYS/PACK) ×2 IMPLANT
WATER STERILE IRR 1000ML POUR (IV SOLUTION) ×2 IMPLANT

## 2012-04-20 NOTE — Anesthesia Preprocedure Evaluation (Signed)
Anesthesia Evaluation  Patient identified by MRN, date of birth, ID band Patient awake    Reviewed: Allergy & Precautions, H&P , Patient's Chart, lab work & pertinent test results  Airway Mallampati: II TM Distance: >3 FB Neck ROM: full    Dental No notable dental hx.    Pulmonary neg pulmonary ROS,  breath sounds clear to auscultation  Pulmonary exam normal       Cardiovascular Rate:Normal     Neuro/Psych negative neurological ROS  negative psych ROS   GI/Hepatic negative GI ROS, Neg liver ROS,   Endo/Other  GestationalMorbid obesity  Renal/GU negative Renal ROS  negative genitourinary   Musculoskeletal negative musculoskeletal ROS (+)   Abdominal (+) + obese,   Peds  Hematology negative hematology ROS (+)   Anesthesia Other Findings   Reproductive/Obstetrics (+) Pregnancy                           Anesthesia Physical Anesthesia Plan  ASA: III  Anesthesia Plan: Spinal   Post-op Pain Management:    Induction:   Airway Management Planned:   Additional Equipment:   Intra-op Plan:   Post-operative Plan:   Informed Consent: I have reviewed the patients History and Physical, chart, labs and discussed the procedure including the risks, benefits and alternatives for the proposed anesthesia with the patient or authorized representative who has indicated his/her understanding and acceptance.     Plan Discussed with: CRNA and Surgeon  Anesthesia Plan Comments:         Anesthesia Quick Evaluation

## 2012-04-20 NOTE — Anesthesia Postprocedure Evaluation (Signed)
Anesthesia Post Note  Patient: Karina Bray  Procedure(s) Performed: Procedure(s) (LRB): CESAREAN SECTION WITH BILATERAL TUBAL LIGATION (N/A)  Anesthesia type: Spinal  Patient location: PACU  Post pain: Pain level controlled  Post assessment: Post-op Vital signs reviewed  Last Vitals:  Filed Vitals:   04/20/12 1330  BP: 134/81  Pulse:   Temp: 36.9 C  Resp:     Post vital signs: Reviewed  Level of consciousness: awake  Complications: No apparent anesthesia complications

## 2012-04-20 NOTE — Anesthesia Procedure Notes (Signed)
Spinal  Patient location during procedure: OR Start time: 04/20/2012 10:53 AM End time: 04/20/2012 10:58 AM Staffing Anesthesiologist: Sandrea Hughs Performed by: anesthesiologist  Preanesthetic Checklist Completed: patient identified, site marked, surgical consent, pre-op evaluation, timeout performed, IV checked, risks and benefits discussed and monitors and equipment checked Spinal Block Patient position: sitting Prep: DuraPrep Patient monitoring: heart rate, cardiac monitor, continuous pulse ox and blood pressure Approach: midline Location: L3-4 Injection technique: single-shot Needle Needle type: Sprotte  Needle gauge: 24 G Needle length: 9 cm Needle insertion depth: 9 cm Assessment Sensory level: T4

## 2012-04-20 NOTE — Transfer of Care (Signed)
Immediate Anesthesia Transfer of Care Note  Patient: Karina Bray  Procedure(s) Performed: Procedure(s) (LRB) with comments: CESAREAN SECTION WITH BILATERAL TUBAL LIGATION (N/A)  Patient Location: PACU  Anesthesia Type: Spinal  Level of Consciousness: awake, alert  and oriented  Airway & Oxygen Therapy: Patient Spontanous Breathing  Post-op Assessment: Report given to PACU RN  Post vital signs: Reviewed and stable  Complications: No apparent anesthesia complications

## 2012-04-20 NOTE — H&P (Signed)
Karina Bray is a 36 y.o. female presenting for repeat c-section. Maternal Medical History:  Reason for admission: Reason for Admission:   nauseaRepeat c-section, bilateral tubal sterilization, removal of cerclage.  Contractions: Frequency: irregular.   Perceived severity is mild.    Fetal activity: Perceived fetal activity is normal.   Last perceived fetal movement was within the past hour.    Prenatal complications: Pre-eclampsia.   Prenatal Complications - Diabetes: gestational. Diabetes is managed by oral agent (monotherapy).     History of incompetent cervix with 25 week miscarriage. Cerclage in last 2 pregnancies, carried to term. C-section with second pregnancy due to breech, repeat c-section with third pregnancy. Early miscarriage with fourth. MacDonald Cerclage placed at 16 weeks this pregnancy. Gestational diabetes - failed 3 hour GTT. On glyburide. Mild preeclampsia, 24 hour urine protein on 04/05/12 was 979. Asymptomatic today.   OB History    Grav Para Term Preterm Abortions TAB SAB Ect Mult Living   5 2 2  0 2  2   2      Past Medical History  Diagnosis Date  . Mild or unspecified pre-eclampsia, antepartum 04/08/2012    no meds  . Diabetes mellitus     gestational-treated with glyburide 2.5mg  in pm-fbs-80, 2 hour pp 86   Patient Active Problem List  Diagnosis  . Supervision of high-risk pregnancy  . Incompetent cervix  . AMA (advanced maternal age) multigravida 35+  . Previous cesarean section  . Gestational diabetes mellitus, antepartum  . Mild or unspecified pre-eclampsia, antepartum  . Maternal atypical antibody complicating pregnancy     Past Surgical History  Procedure Date  . Dilation and curettage of uterus 09/2010  . Cesarean section 2007, 2001  . Cervical cerclage    Family History: family history includes Cancer in her maternal uncle and mother; Diabetes in her maternal grandfather, mother, and paternal grandfather; Hypertension in her father,  paternal grandfather, and paternal grandmother; and Stroke in her paternal grandfather. Social History:  reports that she has never smoked. She has never used smokeless tobacco. She reports that she does not drink alcohol or use illicit drugs.   Prenatal Transfer Tool  Maternal Diabetes: Yes:  Diabetes Type:  Insulin/Medication controlled Genetic Screening: Declined Maternal Ultrasounds/Referrals: Normal Fetal Ultrasounds or other Referrals:  None Maternal Substance Abuse:  No Significant Maternal Medications:  Meds include: Other: Glyburide Significant Maternal Lab Results:  Lab values include: Group B Strep negative, Other: Hct  33.3, platelets 130, Anti-I antibody Other Comments:  None  Review of Systems  Constitutional: Negative for fever and chills.  Eyes: Negative for blurred vision and double vision.  Respiratory: Negative for shortness of breath.   Cardiovascular: Negative for chest pain.  Gastrointestinal: Negative for nausea, vomiting and abdominal pain.  Genitourinary: Negative for dysuria.  Neurological: Positive for headaches. Negative for dizziness.      Pulse 97, temperature 98.4 F (36.9 C), temperature source Oral, resp. rate 16, height 5' (1.524 m), weight 95.709 kg (211 lb), SpO2 100.00%. Maternal Exam:  Abdomen: Surgical scars: low transverse.   Fundal height is Appropriate for GA.   Fetal presentation: vertex     Physical Exam  Constitutional: She is oriented to person, place, and time. She appears well-developed and well-nourished. No distress.  HENT:  Head: Normocephalic and atraumatic.  Eyes: Conjunctivae and EOM are normal.  Neck: Normal range of motion.  Cardiovascular: Normal rate, regular rhythm and normal heart sounds.   Respiratory: Effort normal and breath sounds normal. No respiratory distress.  GI: Soft. There is no tenderness. There is no rebound and no guarding.  Musculoskeletal: She exhibits edema (mild lower extremity).  Neurological:  She is alert and oriented to person, place, and time.  Skin: Skin is warm and dry.  Psychiatric: She has a normal mood and affect.    Prenatal labs: ABO, Rh: --/--/B POS (08/29 1550) Antibody: POS (08/29 1550) Rubella: Immune (04/12 0000) RPR: NON REACTIVE (08/29 1550)  HBsAg: Negative (04/12 0000)  HIV: NON REACTIVE (06/24 1003)  GBS: NEGATIVE (08/26 1206)   Assessment/Plan: 36 y.o. [id number] at [redacted]w[redacted]d here for repeat c-section. 1.  Previous c-section x 2 - proceed with repeat c-section at 37 weeks due to GDM and preeclampsia. Risks reviewed, consent signed. 2.  Undesired fertility - BTS today, consent on chart signed 8/5 3.  Gestational DM (Class A2) - CBG now. Hold glyburide.  4.  Mild preeclampsia. Monitor BP.  5.  Cerclage - remove in OR prior to c-section.    Napoleon Form 04/20/2012, 9:08 AM

## 2012-04-20 NOTE — Addendum Note (Signed)
Addendum  created 04/20/12 1614 by Elbert Ewings, CRNA   Modules edited:Notes Section

## 2012-04-20 NOTE — Op Note (Signed)
Karina Bray  PROCEDURE DATE: 04/20/2012  PREOPERATIVE DIAGNOSIS: Intrauterine pregnancy at  [redacted]w[redacted]d weeks gestation; Gestational DM (Class A2), Preeclampsia, previous uterine incision LTCS; undesired fertility, cerclage  POSTOPERATIVE DIAGNOSIS: The same  PROCEDURE: Removal of Cerclage, Repeat Low Transverse Cesarean Section, Bilateral Tubal Sterilization using Filshie clips  SURGEON:  Dr. Sherre Scarlet  ASSISTANT:  Napoleon Form, MD  ANESTHESIOLOGIST: Aris Georgia, MD, Eustace Quail, CRNA   INDICATIONS:Karina Bray is a 36 y.o. [phone] at [redacted]w[redacted]d here for cerclage removal, repeat cesarean section and bilateral tubal sterilization secondary to the indications listed under preoperative diagnosis; please see preoperative note for further details.  The risks ofsurgery were discussed with the patient including but were not limited to: bleeding which may require transfusion or reoperation; infection which may require antibiotics; injury to bowel, bladder, ureters or other surrounding organs; injury to the fetus; need for additional procedures including hysterectomy in the event of a life-threatening hemorrhage; placental abnormalities wth subsequent pregnancies, incisional problems, thromboembolic phenomenon and other postoperative/anesthesia complications.  Patient also desires permanent sterilization.  Other reversible forms of contraception were discussed with patient; she declines all other modalities. Risks of procedure discussed with patient including but not limited to: risk of regret, permanence of method, bleeding, infection, injury to surrounding organs and need for additional procedures.  Failure risk of 0.5-1% with increased risk of ectopic gestation if pregnancy occurs was also discussed with patient.  The patient concurred with the proposed plan, giving informed written consent for the procedures.    FINDINGS:  Viable female infant in cephalic presentation.  Apgars 9 and 9.  Clear  amniotic fluid.  Intact placenta, three vessel cord.  Normal uterus, fallopian tubes and ovaries bilaterally.  ANESTHESIA: Spinal/Epidural  INTRAVENOUS FLUIDS: 2400 ml  ESTIMATED BLOOD LOSS: 700 ml URINE OUTPUT:  100 ml  SPECIMENS: Placenta sent to L&D  COMPLICATIONS: None immediate  PROCEDURE IN DETAIL:  The patient preoperatively received intravenous antibiotics and had sequential compression devices applied to her lower extremities.   She was then taken to the operating room where spinal anesthesia was administered (the epidural anesthesia was dosed up to surgical level) and was found to be adequate. Timeout was performed. While in supine position, bivalve speculum was inserted in vagina and the cerclage knot was visualized and grasped with ring forceps. The cerclage thread was then cut with scissors under the knot and removed in its entirety. The patient was then placed in a dorsal supine position with a leftward tilt, and prepped and draped in a sterile manner.  A foley catheter was placed into her bladder and attached to constant gravity.  After an adequate timeout was performed, a Pfannenstiel skin incision was made with scalpel and carried through to the underlying layer of fascia. The fascia was incised in the midline, and this incision was extended bilaterally using the Mayo scissors.  Kocher clamps were applied to the superior aspect of the fascial incision and the underlying rectus muscles were dissected off bluntly and sharply and with cautery. A similar process was carried out on the inferior aspect of the fascial incision. The rectus muscles were separated in the midline bluntly and the peritoneum was entered bluntly. The vesicouterine peritoneum was grasped with pickups and entered sharply with Metzenbaum scissors and a bladder flap was created. Attention was turned to the lower uterine segment where a low transverse hysterotomy was made with a scalpel and extended bilaterally bluntly.   The infant was successfully delivered, the cord was clamped and cut and the infant  was handed over to awaiting neonatology team. Uterine massage was then administered, and the placenta delivered intact with a three-vessel cord. The uterus was then cleared of clot and debris.  The hysterotomy was closed with 0 Vicryl in a running locked fashion, and additional 0 Vicryl was used to obtain hemostasis.  Attention was then turned to the fallopian tubes.  A Filshie clip was placed on both tubes, about 3 cm from the cornua, with care given to incorporate the underlying mesosalpinx on both sides, allowing for bilateral tubal sterilization. The pelvis was cleared of all clot and debris. Hemostasis was confirmed on all surfaces.  The peritoneum and the muscles were reapproximated with 0 Monocryl. GelFoam as placed over the rectus muscle. The fascia was then closed using 0 PDS in a running fashion.  The subcutaneous layer was irrigated, and the skin was closed with a 4-0 Vicryl subcuticular stitch. Steristrips and a pressure dressing were applied, followed by abdominal binder. The patient tolerated the procedure well. Sponge, lap, instrument and needle counts were correct x 2.  She was taken to the recovery room in stable condition.

## 2012-04-20 NOTE — Anesthesia Postprocedure Evaluation (Signed)
  Anesthesia Post-op Note  Patient: Karina Bray  Procedure(s) Performed: Procedure(s) (LRB) with comments: CESAREAN SECTION WITH BILATERAL TUBAL LIGATION (N/A)  Patient Location: PACU and Mother/Baby  Anesthesia Type: Spinal  Level of Consciousness: awake and alert   Airway and Oxygen Therapy: Patient Spontanous Breathing  Post-op Pain: mild  Post-op Assessment: Patient's Cardiovascular Status Stable, Respiratory Function Stable, No signs of Nausea or vomiting and Pain level controlled  Post-op Vital Signs: stable  Complications: No apparent anesthesia complications

## 2012-04-20 NOTE — Progress Notes (Signed)
Pt has 3+ edema in feet and lower legs.  Urine output over 3 hours has only been 60mL.  Lungs clear.  Total intake is over 4 hours.  2+ reflexes. No clonus.  No headache or visual problems. Notified Dr Erin Fulling.  Will give IV bolus of of Lactated Ringers and 20mg  of Lasix IV.  Will watch output and PIH symptoms closely.

## 2012-04-21 LAB — CBC
HCT: 26.3 % — ABNORMAL LOW (ref 36.0–46.0)
Hemoglobin: 8.7 g/dL — ABNORMAL LOW (ref 12.0–15.0)
MCHC: 33.1 g/dL (ref 30.0–36.0)
RBC: 3.27 MIL/uL — ABNORMAL LOW (ref 3.87–5.11)
WBC: 4.7 10*3/uL (ref 4.0–10.5)

## 2012-04-21 NOTE — Progress Notes (Signed)
I have seen this patient and agree with the above resident's note with the following addition.  Urine output 1585 in last 24 hours.  Restarted strict I/O monitoring this am.  BPs 130/70s-80s.  Will continue to monitor output/BP.   LEFTWICH-KIRBY, Airi Copado Certified Nurse-Midwife

## 2012-04-21 NOTE — Progress Notes (Signed)
Post Operative Day 1  Subjective: no complaints, up ad lib, voiding and tolerating PO; denies CP or SOB; no BM or flatus yet  Objective: Blood pressure 132/71, pulse 92, temperature 98.5 F (36.9 C), temperature source Oral, resp. rate 18, height 5' (1.524 m), weight 95.709 kg (211 lb), SpO2 95.00%, unknown if currently breastfeeding.  Physical Exam:  General: alert, cooperative, appears stated age and no distress CV: RRR, 2+ bilateral DP pulses PULM: CTAB, nl effort ABD: NABS, wearing soft abdominal binder Lochia: appropriate Uterine Fundus: firm Incision: healing well, no significant drainage, no dehiscence, with bandage over - bandage is dry DVT Evaluation: No evidence of DVT seen on physical exam. No cords or calf tenderness. Calf/Ankle edema is present, wearing SCDs   Basename 04/21/12 0525 04/20/12 0851  HGB 8.7* 10.8*  HCT 26.3* 31.1*    Assessment/Plan: Plan for discharge tomorrow and Contraception BTL done after c-section Bottle-feeding with no issues. Continue monitoring BPs and UOP with h/o pre-eclampsia this pregnancy, got lasix x 1.   LOS: 1 day   Simone Curia 04/21/2012, 8:00 AM

## 2012-04-22 DIAGNOSIS — Z98891 History of uterine scar from previous surgery: Secondary | ICD-10-CM

## 2012-04-22 LAB — TYPE AND SCREEN
Antibody Screen: POSITIVE
Unit division: 0

## 2012-04-22 MED ORDER — HYDROCHLOROTHIAZIDE 12.5 MG PO TABS: 12.5000 mg | ORAL_TABLET | Freq: Every day | ORAL | Status: DC

## 2012-04-22 MED ORDER — SENNOSIDES-DOCUSATE SODIUM 8.6-50 MG PO TABS: 2.0000 | ORAL_TABLET | Freq: Every day | ORAL | Status: AC

## 2012-04-22 MED ORDER — OXYCODONE-ACETAMINOPHEN 5-325 MG PO TABS: 1.0000 | ORAL_TABLET | ORAL | Status: AC | PRN

## 2012-04-22 NOTE — Discharge Summary (Signed)
Obstetric Discharge Summary  Postoperative day 2  Reason for Admission: cesarean section Prenatal Procedures: Preeclampsia Intrapartum Procedures: cesarean: low cervical, transverse, tubal ligation and cerclage removal Postpartum Procedures: none Complications-Operative and Postpartum: none Hemoglobin  Date Value Range Status  04/21/2012 8.7* 12.0 - 15.0 g/dL Final     DELTA CHECK NOTED     REPEATED TO VERIFY  11/28/2011 11.3   Final     HCT  Date Value Range Status  04/21/2012 26.3* 36.0 - 46.0 % Final  11/28/2011 34   Final    Physical Exam:  BP 148/87  Pulse 91  Temp 98.4 F (36.9 C) (Oral)  Resp 20  Ht 5' (1.524 m)  Wt 95.709 kg (211 lb)  BMI 41.21 kg/m2  SpO2 96%  Breastfeeding? Unknown  General: alert, cooperative, appears stated age and no distress CV: RRR PULM: CTAB, nl effort ABD: NABS, obese Lochia: appropriate Uterine Fundus: firm Incision: healing well, no significant drainage, no dehiscence DVT Evaluation: No evidence of DVT seen on physical exam. No cords or calf tenderness. Calf/Ankle edema is present.  Discharge Diagnoses: C-section at 37 weeks, delivered  Discharge Information: Date: 04/22/2012 Activity: pelvic rest Diet: routine Medications: PNV, Colace and Percocet Condition: stable Instructions: refer to practice specific booklet Discharge to: home Contraception BTL done after c-section  Bottle-feeding with no issues.  Pt to start taking HCTZ 12.5 mg daily and have f/u in 2 weeks at [facility].  At that time, check BP and blood glucose. Pt to stop taking glyburide until further notified at f/u appointment.   Newborn Data: Live born female  Birth Weight: 5 lb 11.4 oz (2590 g) APGAR: 9, 9  Home with mother.  Simone Curia 04/22/2012, 8:43 AM

## 2012-04-22 NOTE — Discharge Summary (Signed)
I have seen and examined pt and agree with above. BP has been 140-150s/80-90s last 24 hours. Mild preeclampsia at time of delivery, did not receive magnesium. Pt denies headache, vision changes, RUQ pain. She does have significant lower extremity edema. Home with HCTZ 12.5 mg. F/U in clinic in 2 weeks for BP and incision check. Pt also had A2 GDM. Home with no medications. F/U 2 hour GTT at 6 weeks post-partum. Napoleon Form, MD 04/22/2012 9:40 AM

## 2012-04-22 NOTE — Progress Notes (Signed)
NST reactive 04/01/12 

## 2012-04-27 NOTE — Op Note (Signed)
I was present and scrubbed for entire procedure.  There were no complications.  Joud Pettinato L. Harraway-Smith, M.D., Evern Core

## 2012-04-30 NOTE — Progress Notes (Signed)
NST reactive 04/08/12

## 2012-05-03 ENCOUNTER — Encounter: Admission: RE | Payer: Self-pay | Source: Ambulatory Visit

## 2012-05-03 ENCOUNTER — Inpatient Hospital Stay: Admission: RE | Admit: 2012-05-03 | Payer: BC Managed Care – PPO | Source: Ambulatory Visit | Admitting: Family Medicine

## 2012-05-03 SURGERY — Surgical Case
Anesthesia: Regional | Site: Abdomen

## 2012-05-03 NOTE — Progress Notes (Incomplete)
8/29 NST reviewed and reactive 

## 2012-05-07 ENCOUNTER — Ambulatory Visit (INDEPENDENT_AMBULATORY_CARE_PROVIDER_SITE_OTHER): Payer: BC Managed Care – PPO | Admitting: Obstetrics and Gynecology

## 2012-05-07 ENCOUNTER — Encounter: Payer: Self-pay | Admitting: Obstetrics and Gynecology

## 2012-05-07 VITALS — BP 137/86 | HR 76 | Temp 98.1°F | Ht 60.0 in | Wt 181.7 lb

## 2012-05-07 DIAGNOSIS — O169 Unspecified maternal hypertension, unspecified trimester: Secondary | ICD-10-CM

## 2012-05-07 DIAGNOSIS — IMO0002 Reserved for concepts with insufficient information to code with codable children: Secondary | ICD-10-CM

## 2012-05-07 NOTE — Patient Instructions (Signed)

## 2012-05-07 NOTE — Progress Notes (Signed)
Chief Complaint:  Pre-Eclampsia     HPI: Karina Bray is a 36 y.o. [id number]  17 days post cesarean section who is here for blood pressure check. Taking her hydrochlorothiazide as directed.  She is feeling well by this concerned with an odor coming from her cesarean scar and would like to have this checked.    Past Medical History: Past Medical History  Diagnosis Date  . Mild or unspecified pre-eclampsia, antepartum 04/08/2012    no meds  . Diabetes mellitus     gestational-treated with glyburide 2.5mg  in pm-fbs-80, 2 hour pp 86    Past obstetric history: OB History    Grav Para Term Preterm Abortions TAB SAB Ect Mult Living   5 3 3  0 2  2   3      # Outc Date GA Lbr Len/2nd Wgt Sex Del Anes PTL Lv   1 SAB 12/00           2 TRM 10/01 [redacted]w[redacted]d  7lb7oz(3.374kg) M CS Spinal  Yes   Comments: cerclage needed for incompetent cervix   3 TRM 7/06 [redacted]w[redacted]d  5lb4oz(2.381kg) F CS Spinal  Yes   Comments: cerclage   4 SAB 2/12 [redacted]w[redacted]d          5 TRM 9/13 [redacted]w[redacted]d 00:00 5lb11.4oz(2.59kg) F LTCS Spinal  Yes      Past Surgical History: Past Surgical History  Procedure Date  . Dilation and curettage of uterus 09/2010  . Cesarean section 2007, 2001  . Cervical cerclage     Family History: Family History  Problem Relation Age of Onset  . Diabetes Mother   . Cancer Mother     breast or ovarian  . Hypertension Father   . Cancer Maternal Uncle     brain  . Diabetes Maternal Grandfather   . Hypertension Paternal Grandmother   . Diabetes Paternal Grandfather   . Hypertension Paternal Grandfather   . Stroke Paternal Grandfather     Social History: History  Substance Use Topics  . Smoking status: Never Smoker   . Smokeless tobacco: Never Used  . Alcohol Use: No    Allergies: No Known Allergies  Meds: PNV and HCTZ 25 mg/d  ROS: Pertinent findings in history of present illness.  Physical Exam  Blood pressure 137/86, pulse 76, temperature 98.1 F (36.7 C), temperature source Oral,  height 5' (1.524 m), weight 181 lb 11.2 oz (82.419 kg), not currently breastfeeding. GENERAL: Well-developed, well-nourished female in no acute distress.  HEENT: normocephalic HEART: normal rate RESP: normal effort ABDOMEN: Soft, non-tender, obese with pannus; Pfannensteil scar well healed without eryhema or induration. Malodor persent EXTREMITIES: Nontender, no edema NEURO: alert and oriented  Assessment: 17 d post op LTCS S/P preeclampsia, doing well on HCTZ  Plan: Continue current management. Keep incisional area clean and packed dry, use cornstarch, use sanitary pads over area if excessive perspiration PP visit in 3 wks Advised to find PMD of her choice  Danae Orleans, CNM 05/07/2012 11:02 AM

## 2012-05-13 ENCOUNTER — Ambulatory Visit: Payer: BC Managed Care – PPO | Admitting: Obstetrics and Gynecology

## 2012-06-03 ENCOUNTER — Ambulatory Visit (INDEPENDENT_AMBULATORY_CARE_PROVIDER_SITE_OTHER): Payer: BC Managed Care – PPO | Admitting: Advanced Practice Midwife

## 2012-06-03 ENCOUNTER — Encounter: Payer: Self-pay | Admitting: Advanced Practice Midwife

## 2012-06-03 NOTE — Progress Notes (Signed)
  Subjective:     Karina Bray is a 36 y.o. female who presents for a postpartum visit. She is 6 weeks postpartum following a low cervical transverse Cesarean section. I have fully reviewed the prenatal and intrapartum course. The delivery was at 37 gestational weeks. Outcome: repeat cesarean section, low transverse incision. Anesthesia: spinal. Postpartum course has been normal. Baby's course has been normal. Baby is feeding by bottle. Bleeding staining only. Bowel function is normal. Bladder function is normal. Patient is sexually active. Contraception method is tubal ligation. Postpartum depression screening: negative.  The following portions of the patient's history were reviewed and updated as appropriate: allergies, current medications, past family history, past medical history, past social history, past surgical history and problem list.  Review of Systems Pertinent items are noted in HPI.   Objective:    BP 137/88  Pulse 73  Temp 97 F (36.1 C) (Oral)  Ht 5\' 2"  (1.575 m)  Wt 81.738 kg (180 lb 3.2 oz)  BMI 32.96 kg/m2  Breastfeeding? No  General:  alert, cooperative and no distress   Breasts:  inspection negative, no nipple discharge or bleeding, no masses or nodularity palpable  Lungs: clear to auscultation bilaterally  Heart:  regular rate and rhythm, S1, S2 normal, no murmur, click, rub or gallop  Abdomen: soft, non-tender; bowel sounds normal; no masses,  no organomegaly   Vulva:  normal  Vagina: normal vagina, no discharge, exudate, lesion, or erythema  Cervix:  no lesions and cerclage suture remains in place  Corpus: normal size, contour, position, consistency, mobility, non-tender  Adnexa:  normal adnexa and no mass, fullness, tenderness  Rectal Exam: Not performed.        During pelvic exam, cerclage suture visible and palpable by bimanual exam.  Removal of cerclage took place at time of C/S but second suture seen today above scar from removed suture.  Suture cut and  removed during pelvic exam with minimal discomfort for the pt.   Assessment:    Normal postpartum exam.  Pap smear not done at today's visit.   Cerclage removed with assistance by Dr Erin Fulling.   Plan:    1. Contraception: tubal ligation 2. Follow up as needed.

## 2012-06-11 ENCOUNTER — Encounter: Payer: Self-pay | Admitting: *Deleted

## 2012-06-11 ENCOUNTER — Telehealth: Payer: Self-pay | Admitting: *Deleted

## 2012-06-11 NOTE — Telephone Encounter (Signed)
Patient came to window requesting form to allow her to go back to work, and papers signed. Both completed

## 2012-10-02 ENCOUNTER — Other Ambulatory Visit: Payer: Self-pay

## 2012-11-19 IMAGING — US US OB TRANSVAGINAL
1 series · 12 of 28 positions shown · non-contrast
Comparison: none

[Series 1: us ob transvaginal · 0.22mm/px · 12 of 93 slices shown]
[im 4/93]
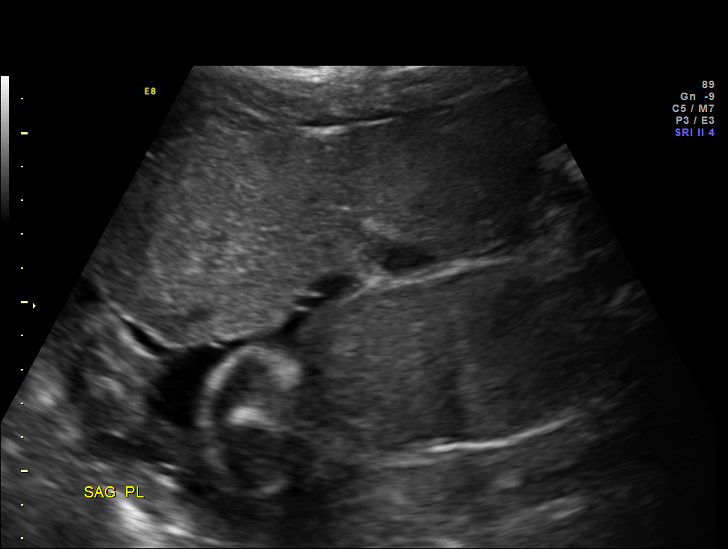
[im 11/93]
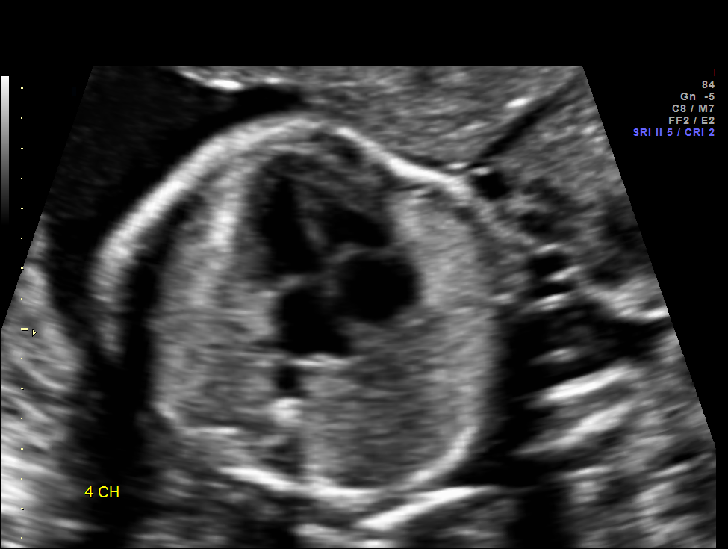
[im 18/93]
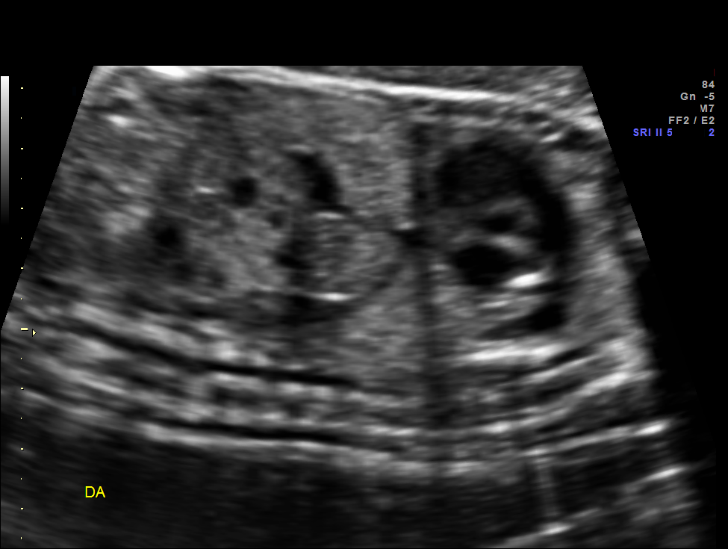
[im 28/93]
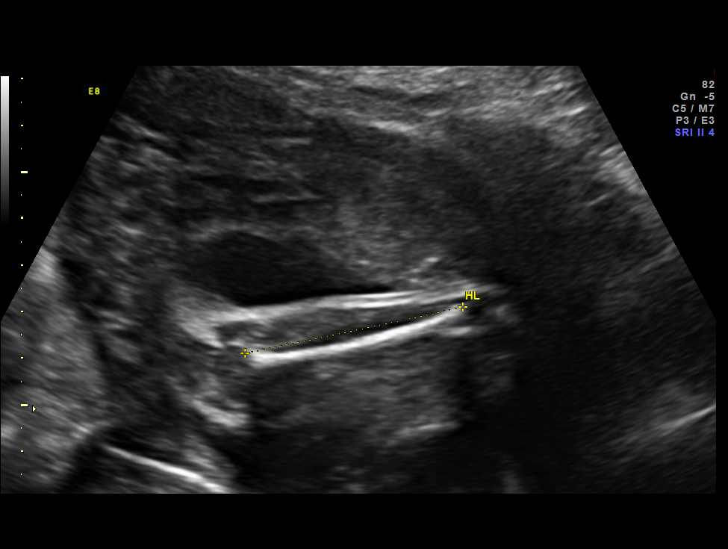
[im 35/93]
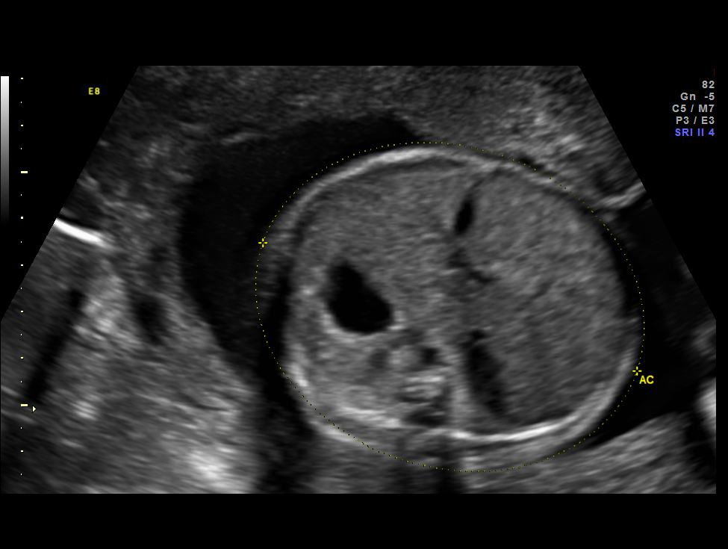
[im 41/93]
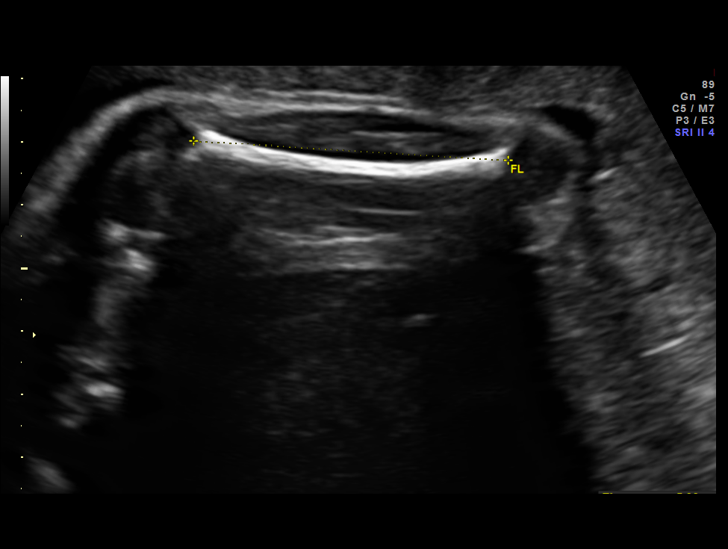
[im 52/93]
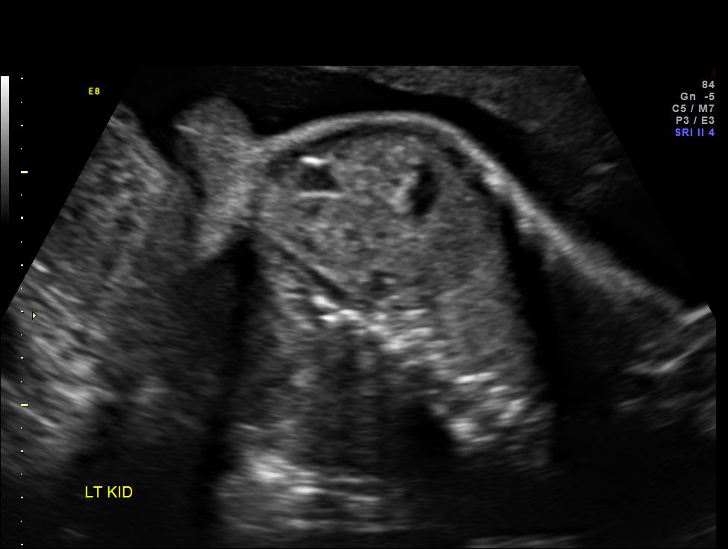
[im 58/93]
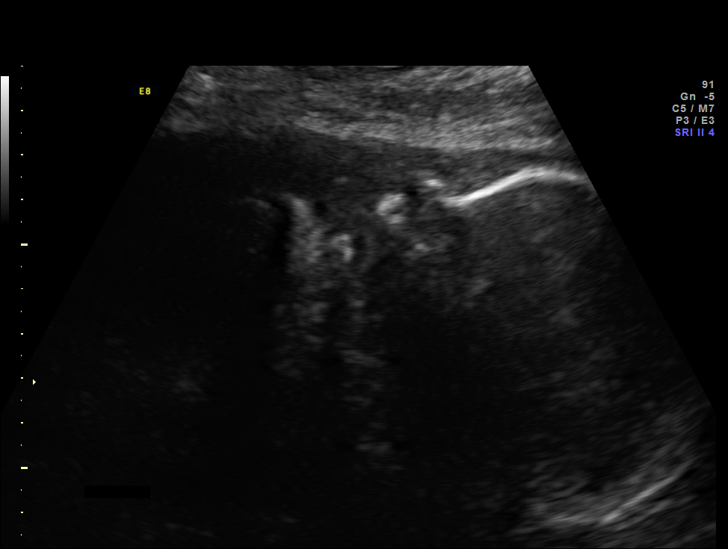
[im 65/93]
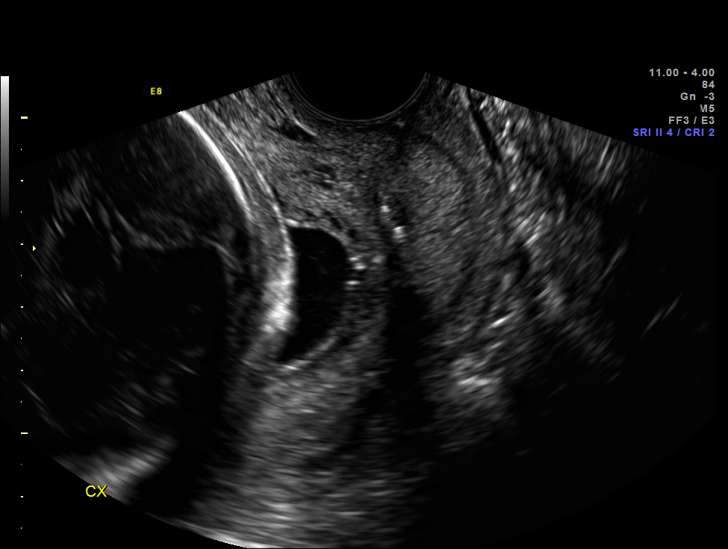
[im 75/93]
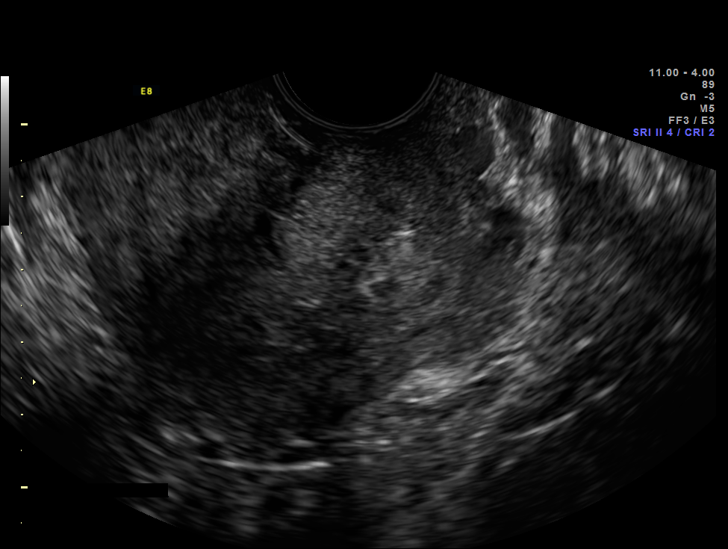
[im 82/93]
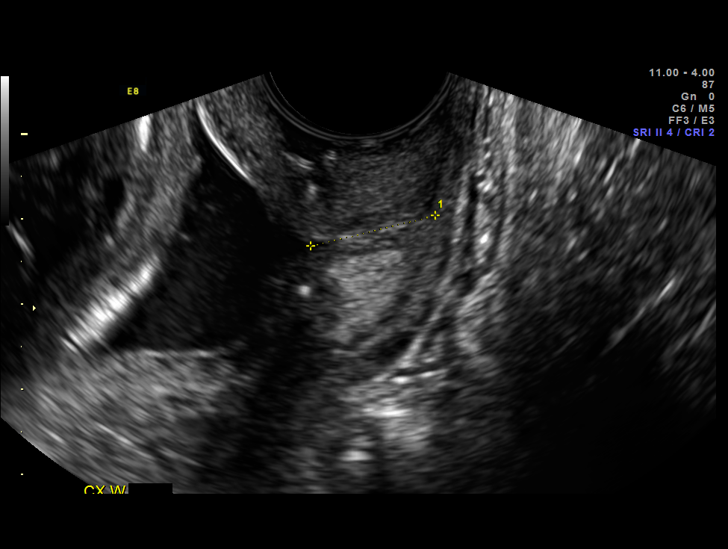
[im 89/93]
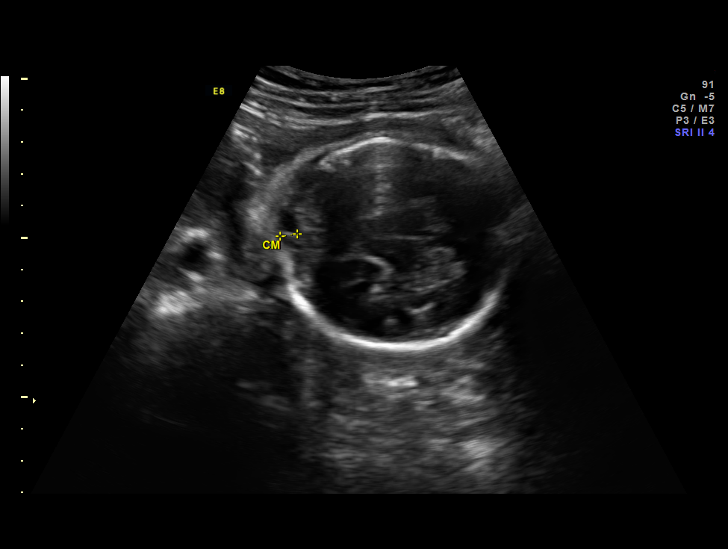

[12 of 28 positions shown; findings below may reference images not displayed]

OBSTETRICS REPORT
                      (Signed Final 02/16/2012 [DATE])

 Order#:         [id number]
                 82_O
Procedures

 US OB DETAIL + 14 WK                                  76811.0
 US OB TRANSVAGINAL                                    76817.0
Indications

 Advanced maternal age (AMA), Multigravida
 Diabetes - Gestational, A2
 Previous cesarean section x 2
 Poor obstetric history: Previous neonatal death
 Poor obstetric history: Previous preterm delivery
Fetal Evaluation

 Fetal Heart Rate:  150                          bpm
 Cardiac Activity:  Observed
 Presentation:      Cephalic
 Placenta:          Anterior, above cervical os
 P. Cord            Visualized
 Insertion:

 Amniotic Fluid
 AFI FV:      Subjectively within normal limits
 AFI Sum:     10.66   cm       16  %Tile     Larg Pckt:    3.92  cm
 RUQ:   2.95    cm   RLQ:    3.92   cm    LUQ:   2.32    cm   LLQ:    1.47   cm
Biometry

 BPD:     67.2  mm     G. Age:  27w 1d                CI:         81.6   70 - 86
 OFD:     82.4  mm                                    FL/HC:      21.1   18.8 -

 HC:     239.7  mm     G. Age:  26w 0d      < 3  %    HC/AC:      0.99   1.05 -

 AC:     242.5  mm     G. Age:  28w 4d       58  %    FL/BPD:     75.3   71 - 87
 FL:      50.6  mm     G. Age:  27w 1d       15  %    FL/AC:      20.9   20 - 24
 HUM:       48  mm     G. Age:  28w 1d       50  %

 Est. FW:    4446  gm      2 lb 7 oz     45  %
Gestational Age
 LMP:           28w 0d        Date:  08/04/11                 EDD:   05/10/12
 U/S Today:     27w 1d                                        EDD:   05/16/12
 Best:          28w 0d     Det. By:  LMP  (08/04/11)          EDD:   05/10/12
Anatomy

 Cranium:           Appears normal      Ductal Arch:       Appears normal
 Ventricles:        Appears normal      Diaphragm:         Appears normal
 Choroid Plexus:    Appears normal      Stomach:           Appears
                                                           normal, left
                                                           sided
 Cerebellum:        Not well            Abdomen:           Appears normal
                    visualized
 Posterior Fossa:   Appears normal      Abdominal Wall:    Appears nml
                                                           (cord insert,
                                                           abd wall)
 Nuchal Fold:       Not applicable      Cord Vessels:      Appears normal
                    (>20 wks GA)                           (3 vessel cord)
 Face:              Not well            Kidneys:           Appear normal
                    visualized
 Heart:             Appears normal      Bladder:           Appears normal
                    (4 chamber &
                    axis)
 RVOT:              Appears normal      Spine:             Not well
                                                           visualized
 LVOT:              Appears normal      Limbs:             Appears normal
                                                           (hands, ankles,
                                                           feet)
 Aortic Arch:       Appears normal

 Other:     Heels appears normal. Fetus appears to be a female.
Cervix Uterus Adnexa

 Cervical Length:    1.7      cm

 Cervix:       Normal appearance by transvaginal scan
Impression

 Single IUP at 28 0/7 weeks
 Normal anatomic fetal survey.
 Somewhat limited views of the fetal spine were obtained due
 to fetal position
 Normal amniotic fluid volume
 A cervical length of 1.7 cm was noted. The cerclage stitch
 appears intact
Recommendations

 Recommend follow up ultrasound in 4 weeks to reevaluate
 fetal anatomy and growth.

 questions or concerns.

## 2012-12-27 IMAGING — US US OB FOLLOW-UP
1 series · 12 of 16 positions shown · non-contrast
Comparison: none

[Series 1: us ob follow up · 12 of 16 slices shown]
[im 1/16]
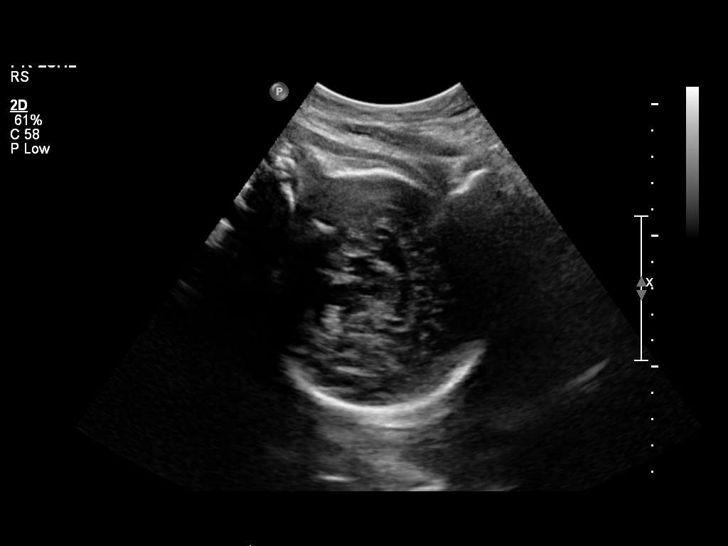
[im 3/16]
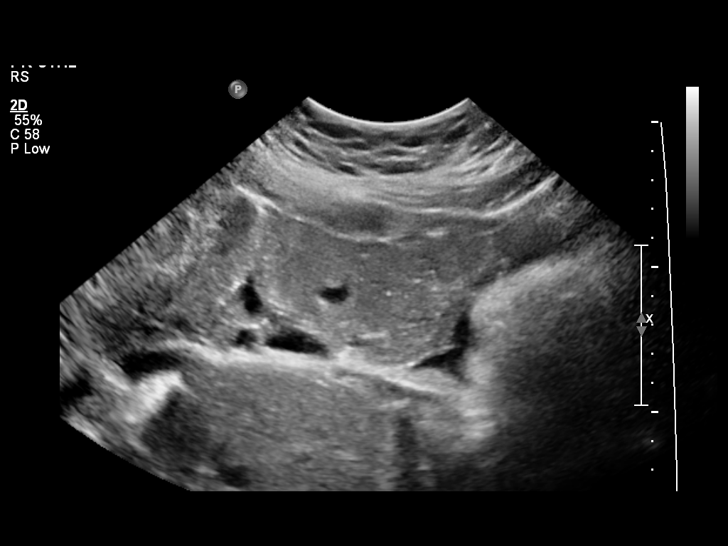
[im 4/16]
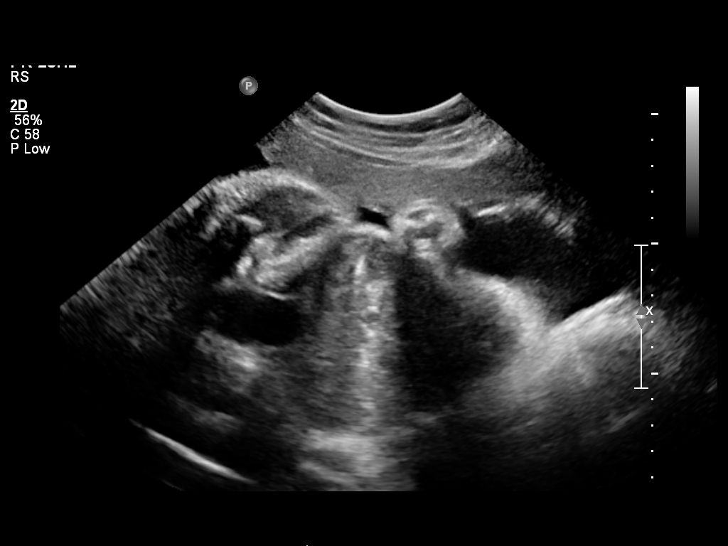
[im 5/16]
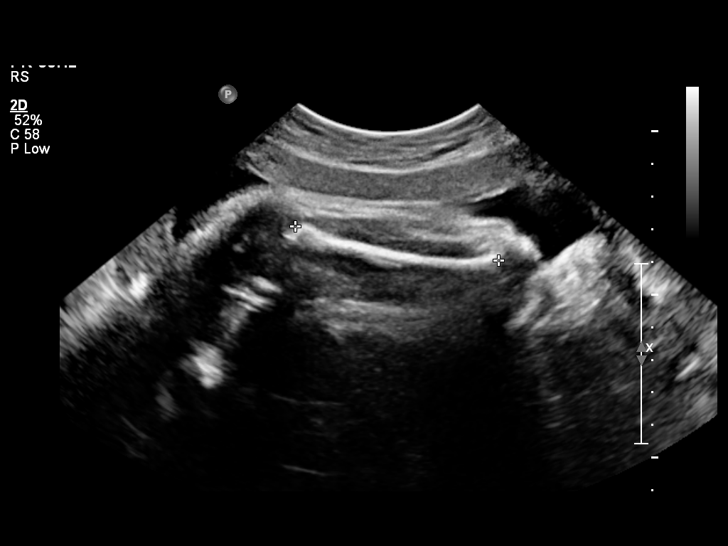
[im 7/16]
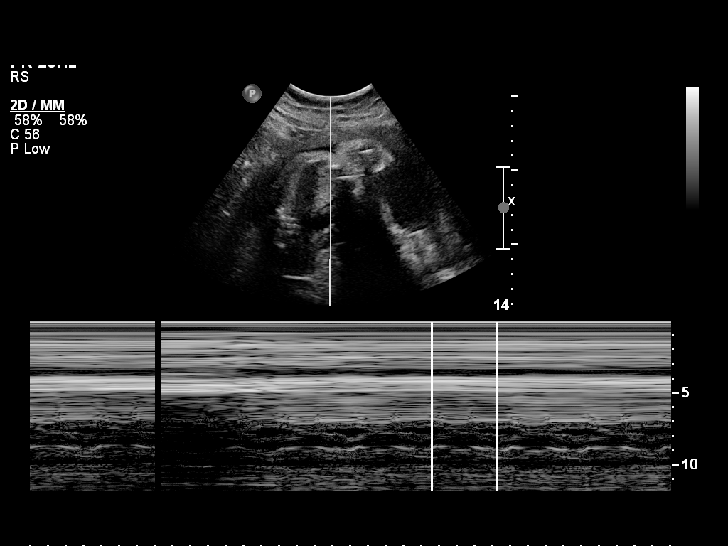
[im 8/16]
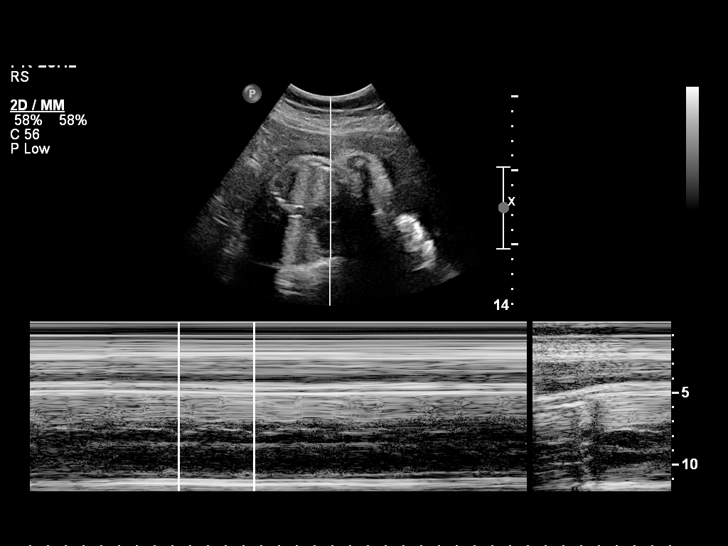
[im 9/16]
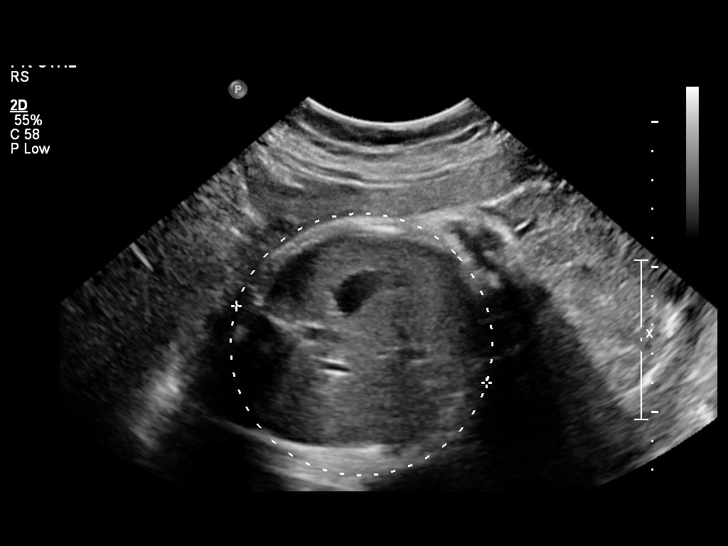
[im 11/16]
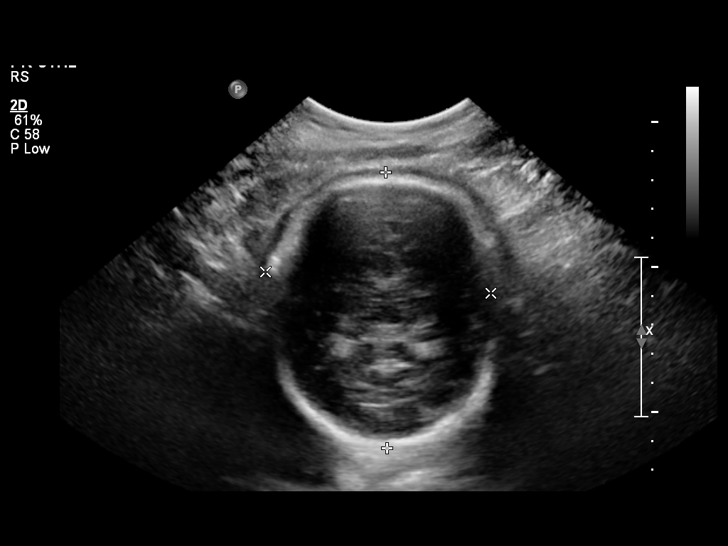
[im 12/16]
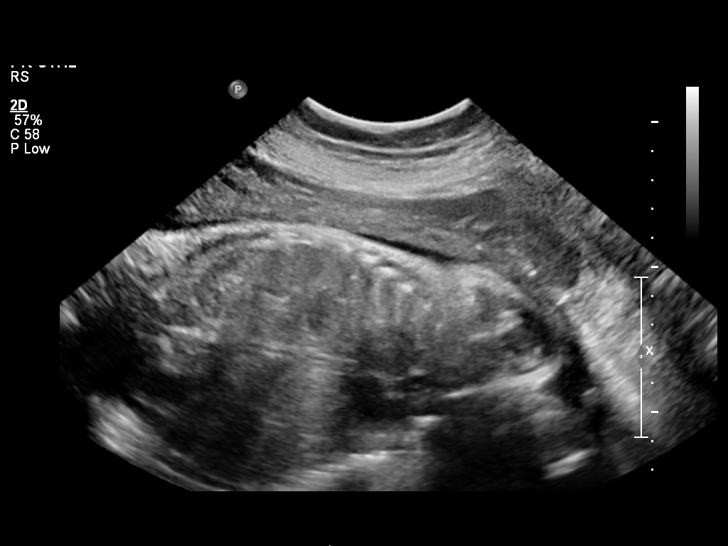
[im 13/16]
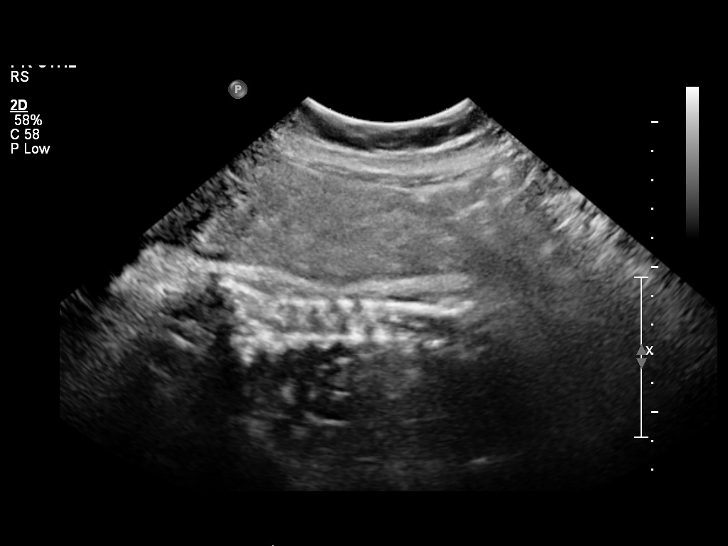
[im 15/16]
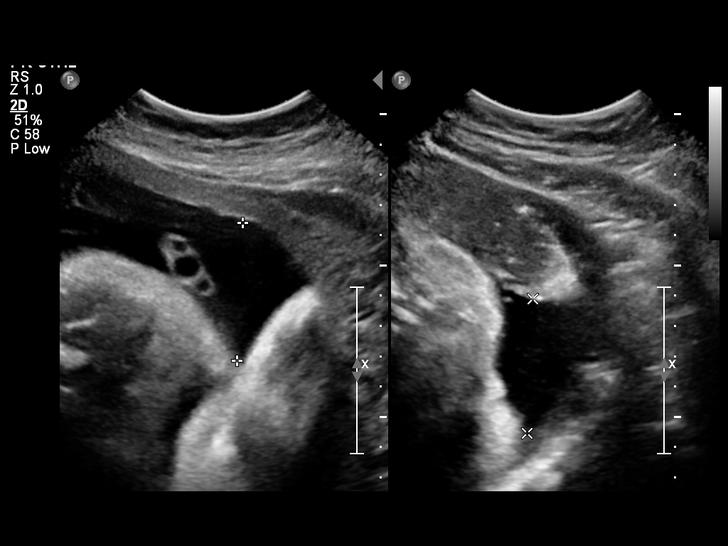
[im 16/16]
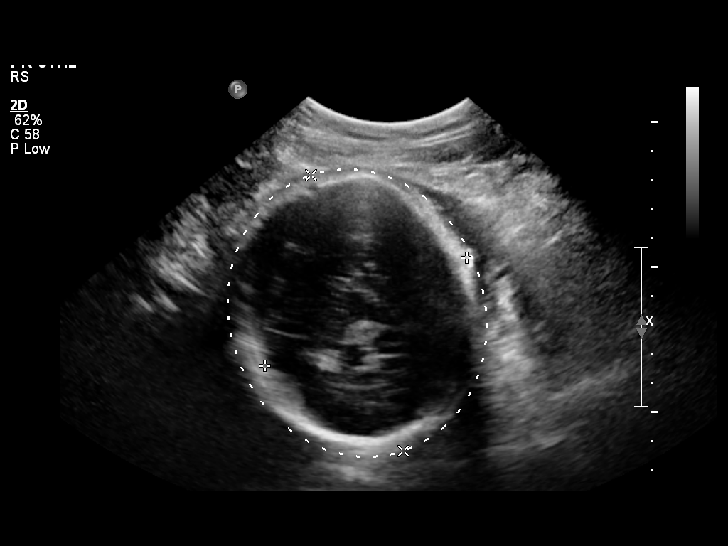

[12 of 16 positions shown; findings below may reference images not displayed]

OBSTETRICS REPORT
                      (Signed Final 03/25/2012 [DATE])

                 CNM
 Order#:         [id number]
Procedures

 US OB FOLLOW UP                                       76816.1
Indications

 Assess Fetal Growth / Estimated Fetal Weight
 Diabetes - Gestational, A2
 Advanced maternal age (AMA), Multigravida
Fetal Evaluation

 Fetal Heart Rate:  165                         bpm
 Cardiac Activity:  Observed
 Presentation:      Cephalic
 Placenta:          Anterior, above cervical os

 Amniotic Fluid
 AFI FV:      Subjectively within normal limits
 AFI Sum:     14.35   cm      50   %Tile     Larg Pckt:   4.54   cm
 RUQ:   3.24   cm    RLQ:    4.54   cm    LUQ:   4.43    cm   LLQ:    2.14   cm
Biometry

 BPD:     78.6  mm    G. Age:   31w 4d                CI:         82.2   70 - 86
 OFD:     95.6  mm                                    FL/HC:      21.7   19.9 -

 HC:     294.5  mm    G. Age:   32w 4d        5  %    HC/AC:      1.04   0.96 -

 AC:     282.3  mm    G. Age:   32w 2d       21  %    FL/BPD:     81.4   71 - 87
 FL:        64  mm    G. Age:   33w 1d       29  %    FL/AC:      22.7   20 - 24
 Est. FW:    1623  gm      4 lb 6 oz     40  %
Gestational Age

 LMP:           33w 3d       Date:   08/04/11                 EDD:   05/10/12
 U/S Today:     32w 3d                                        EDD:   05/17/12
 Best:          33w 3d    Det. By:   LMP  (08/04/11)          EDD:   05/10/12
Anatomy
 Cranium:           Appears normal      Aortic Arch:       Previously seen
 Fetal Cavum:       Appears normal      Ductal Arch:       Previously seen
 Ventricles:        Not well            Diaphragm:         Appears normal
                    visualized
                    today. Seen
                    previously
 Choroid Plexus:    Previously seen     Stomach:           Appears
                                                           normal, left
                                                           sided
 Cerebellum:        Previously seen     Abdomen:           Previously seen
 Posterior Fossa:   Previously seen     Abdominal Wall:    Previously seen
 Nuchal Fold:       Previously seen     Cord Vessels:      Previously seen
 Face:              Previously seen     Kidneys:           Appear normal
 Heart:             Not well            Bladder:           Appears normal
                    visualized
                    today. Seen
                    previously.
 RVOT:              Previously seen     Spine:             Previously seen
 LVOT:              Previously seen     Limbs:             Previously seen
Cervix Uterus Adnexa

 Cervix:       Not visualized (advanced GA >29 wks)
Impression

 Single intrauterine gestation demonstrating an estimated
 gestational age by ultrasound of 32w 3d. This is correlated
 with expected estimated gestational age by LMP of 33w 3d.
 EFW is currently at the 40%.

 No late developing fetal anatomic abnormalities are noted
 associated with the stomach, kidneys or bladder. The four
 chamber heart and lateral ventricles could not be well
 assessed due to fetal positioning combined with advanced
 gestational age and maternal body habitus.

 Subjectively and quantitatively normal amniotic fluid volume.

 questions or concerns.

## 2013-06-23 ENCOUNTER — Other Ambulatory Visit: Payer: Self-pay

## 2014-06-19 ENCOUNTER — Encounter: Payer: Self-pay | Admitting: Advanced Practice Midwife

## 2019-04-12 DIAGNOSIS — Z8632 Personal history of gestational diabetes: Secondary | ICD-10-CM | POA: Insufficient documentation

## 2019-05-03 DIAGNOSIS — E559 Vitamin D deficiency, unspecified: Secondary | ICD-10-CM | POA: Insufficient documentation

## 2021-12-26 DIAGNOSIS — S39012A Strain of muscle, fascia and tendon of lower back, initial encounter: Secondary | ICD-10-CM | POA: Diagnosis not present

## 2021-12-26 DIAGNOSIS — Y92410 Unspecified street and highway as the place of occurrence of the external cause: Secondary | ICD-10-CM | POA: Diagnosis not present

## 2021-12-26 DIAGNOSIS — Y999 Unspecified external cause status: Secondary | ICD-10-CM | POA: Diagnosis not present

## 2022-09-23 ENCOUNTER — Encounter: Payer: Self-pay | Admitting: Nurse Practitioner

## 2022-09-23 ENCOUNTER — Ambulatory Visit: Payer: BC Managed Care – PPO | Admitting: Nurse Practitioner

## 2022-09-23 VITALS — BP 118/76 | HR 83 | Ht 58.75 in | Wt 188.0 lb

## 2022-09-23 DIAGNOSIS — Z130 Encounter for screening for diseases of the blood and blood-forming organs and certain disorders involving the immune mechanism: Secondary | ICD-10-CM | POA: Diagnosis not present

## 2022-09-23 DIAGNOSIS — D259 Leiomyoma of uterus, unspecified: Secondary | ICD-10-CM | POA: Diagnosis not present

## 2022-09-23 DIAGNOSIS — N920 Excessive and frequent menstruation with regular cycle: Secondary | ICD-10-CM | POA: Diagnosis not present

## 2022-09-23 MED ORDER — TRANEXAMIC ACID 650 MG PO TABS: 1300.0000 mg | ORAL_TABLET | Freq: Three times a day (TID) | ORAL | 1 refills | Status: DC

## 2022-09-23 NOTE — Progress Notes (Signed)
   Acute Office Visit  Subjective:    Patient ID: Karina Bray, female    DOB: 01/11/1976, 47 y.o.   MRN: [id number]   HPI 47 y.o. presents as new patient for heavy menses. She has had heavy menses for years but they are getting worse. Cycles occur monthly. First day is light with cramps, heaviest days occur cycle days 2-3 with large clots and requiring changing of large pads + depends every few hours, then tapers off for total bleeding time of 5 days. Reports having an MRI about a year ago and fibroids were seen. Eats a lot of ice. BTL. Normal pap history, overdue.    Review of Systems  Constitutional: Negative.   Gastrointestinal:  Positive for abdominal pain (Pressure in lower abdomen).  Genitourinary:  Positive for menstrual problem.       Objective:    Physical Exam Constitutional:      Appearance: Normal appearance. She is obese.  Genitourinary:    General: Normal vulva.     Vagina: Normal.     Cervix: Normal.     Uterus: Enlarged. Not tender.      Adnexa: Right adnexa normal and left adnexa normal.     BP 118/76   Pulse 83   Ht 4' 10.75" (1.492 m)   Wt 188 lb (85.3 kg)   LMP 08/29/2022 (Exact Date)   SpO2 99%   BMI 38.30 kg/m  Wt Readings from Last 3 Encounters:  09/23/22 188 lb (85.3 kg)  06/03/12 180 lb 3.2 oz (81.7 kg)  05/07/12 181 lb 11.2 oz (82.4 kg)        Patient informed chaperone available to be present for breast and/or pelvic exam. Patient has requested no chaperone to be present. Patient has been advised what will be completed during breast and pelvic exam.   Assessment & Plan:   Problem List Items Addressed This Visit   None Visit Diagnoses     Menorrhagia with regular cycle    -  Primary   Relevant Medications   tranexamic acid (LYSTEDA) 650 MG TABS tablet   Other Relevant Orders   US PELVIS TRANSVAGINAL NON-OB (TV ONLY)   CBC with Differential/Platelet   Iron, TIBC and Ferritin Panel   Uterine leiomyoma, unspecified location        Relevant Orders   US PELVIS TRANSVAGINAL NON-OB (TV ONLY)   Screening for iron deficiency anemia       Relevant Orders   CBC with Differential/Platelet   Iron, TIBC and Ferritin Panel      Plan: Enlarged uterus on exam. Will schedule pelvic ultrasound to evaluate fibroids. Discussed management options to include hormonal contraception, Lysteda, or Myfembree/Oriahnn.She would like Lysteda. Will check iron panel. Needs annual exam and will schedule soon.      Tamela Gammon DNP, 2:07 PM 09/23/2022

## 2022-09-24 ENCOUNTER — Other Ambulatory Visit: Payer: Self-pay | Admitting: Nurse Practitioner

## 2022-09-24 DIAGNOSIS — D500 Iron deficiency anemia secondary to blood loss (chronic): Secondary | ICD-10-CM

## 2022-09-24 LAB — CBC WITH DIFFERENTIAL/PLATELET
Absolute Monocytes: 326 cells/uL (ref 200–950)
Basophils Absolute: 31 cells/uL (ref 0–200)
Basophils Relative: 0.7 %
Eosinophils Absolute: 101 cells/uL (ref 15–500)
Eosinophils Relative: 2.3 %
HCT: 29.8 % — ABNORMAL LOW (ref 35.0–45.0)
Hemoglobin: 8.7 g/dL — ABNORMAL LOW (ref 11.7–15.5)
Lymphs Abs: 1280 cells/uL (ref 850–3900)
MCH: 19.8 pg — ABNORMAL LOW (ref 27.0–33.0)
MCHC: 29.2 g/dL — ABNORMAL LOW (ref 32.0–36.0)
MCV: 67.7 fL — ABNORMAL LOW (ref 80.0–100.0)
Monocytes Relative: 7.4 %
Neutro Abs: 2662 cells/uL (ref 1500–7800)
Neutrophils Relative %: 60.5 %
Platelets: 271 10*3/uL (ref 140–400)
RBC: 4.4 10*6/uL (ref 3.80–5.10)
RDW: 17.5 % — ABNORMAL HIGH (ref 11.0–15.0)
Total Lymphocyte: 29.1 %
WBC: 4.4 10*3/uL (ref 3.8–10.8)

## 2022-09-24 LAB — IRON,TIBC AND FERRITIN PANEL
%SAT: 4 % (calc) — ABNORMAL LOW (ref 16–45)
Ferritin: 4 ng/mL — ABNORMAL LOW (ref 16–232)
Iron: 15 ug/dL — ABNORMAL LOW (ref 40–190)
TIBC: 389 mcg/dL (calc) (ref 250–450)

## 2022-09-24 LAB — CBC MORPHOLOGY

## 2022-09-24 MED ORDER — FERROUS SULFATE 325 (65 FE) MG PO TBEC: 325.0000 mg | DELAYED_RELEASE_TABLET | Freq: Two times a day (BID) | ORAL | 0 refills | Status: AC

## 2022-10-01 ENCOUNTER — Encounter: Payer: Self-pay | Admitting: Family

## 2022-10-01 ENCOUNTER — Other Ambulatory Visit: Payer: Self-pay

## 2022-10-06 ENCOUNTER — Other Ambulatory Visit: Payer: Self-pay | Admitting: Family

## 2022-10-06 ENCOUNTER — Other Ambulatory Visit: Payer: Self-pay

## 2022-10-06 ENCOUNTER — Encounter: Payer: Self-pay | Admitting: Family

## 2022-10-06 ENCOUNTER — Inpatient Hospital Stay (HOSPITAL_BASED_OUTPATIENT_CLINIC_OR_DEPARTMENT_OTHER): Payer: BC Managed Care – PPO | Admitting: Family

## 2022-10-06 ENCOUNTER — Inpatient Hospital Stay: Payer: BC Managed Care – PPO | Attending: Hematology & Oncology

## 2022-10-06 VITALS — BP 141/87 | HR 75 | Temp 99.1°F | Resp 17 | Ht <= 58 in | Wt 193.0 lb

## 2022-10-06 DIAGNOSIS — D500 Iron deficiency anemia secondary to blood loss (chronic): Secondary | ICD-10-CM | POA: Diagnosis not present

## 2022-10-06 DIAGNOSIS — N920 Excessive and frequent menstruation with regular cycle: Secondary | ICD-10-CM | POA: Diagnosis not present

## 2022-10-06 DIAGNOSIS — Z808 Family history of malignant neoplasm of other organs or systems: Secondary | ICD-10-CM | POA: Insufficient documentation

## 2022-10-06 DIAGNOSIS — Z8041 Family history of malignant neoplasm of ovary: Secondary | ICD-10-CM | POA: Diagnosis not present

## 2022-10-06 DIAGNOSIS — D649 Anemia, unspecified: Secondary | ICD-10-CM

## 2022-10-06 DIAGNOSIS — E119 Type 2 diabetes mellitus without complications: Secondary | ICD-10-CM | POA: Diagnosis not present

## 2022-10-06 LAB — CBC WITH DIFFERENTIAL (CANCER CENTER ONLY)
Abs Immature Granulocytes: 0.01 10*3/uL (ref 0.00–0.07)
Basophils Absolute: 0 10*3/uL (ref 0.0–0.1)
Basophils Relative: 1 %
Eosinophils Absolute: 0.1 10*3/uL (ref 0.0–0.5)
Eosinophils Relative: 3 %
HCT: 31.3 % — ABNORMAL LOW (ref 36.0–46.0)
Hemoglobin: 9.2 g/dL — ABNORMAL LOW (ref 12.0–15.0)
Immature Granulocytes: 0 %
Lymphocytes Relative: 30 %
Lymphs Abs: 1.1 10*3/uL (ref 0.7–4.0)
MCH: 20.4 pg — ABNORMAL LOW (ref 26.0–34.0)
MCHC: 29.4 g/dL — ABNORMAL LOW (ref 30.0–36.0)
MCV: 69.2 fL — ABNORMAL LOW (ref 80.0–100.0)
Monocytes Absolute: 0.2 10*3/uL (ref 0.1–1.0)
Monocytes Relative: 5 %
Neutro Abs: 2.2 10*3/uL (ref 1.7–7.7)
Neutrophils Relative %: 61 %
Platelet Count: 201 10*3/uL (ref 150–400)
RBC: 4.52 MIL/uL (ref 3.87–5.11)
RDW: 22.2 % — ABNORMAL HIGH (ref 11.5–15.5)
WBC Count: 3.6 10*3/uL — ABNORMAL LOW (ref 4.0–10.5)
nRBC: 0 % (ref 0.0–0.2)

## 2022-10-06 LAB — CMP (CANCER CENTER ONLY)
ALT: 11 U/L (ref 0–44)
AST: 15 U/L (ref 15–41)
Albumin: 4.1 g/dL (ref 3.5–5.0)
Alkaline Phosphatase: 73 U/L (ref 38–126)
Anion gap: 8 (ref 5–15)
BUN: 10 mg/dL (ref 6–20)
CO2: 29 mmol/L (ref 22–32)
Calcium: 9 mg/dL (ref 8.9–10.3)
Chloride: 103 mmol/L (ref 98–111)
Creatinine: 0.82 mg/dL (ref 0.44–1.00)
GFR, Estimated: >60 mL/min (ref >60)
Glucose, Bld: 140 mg/dL — ABNORMAL HIGH (ref 70–99)
Potassium: 3.1 mmol/L — ABNORMAL LOW (ref 3.5–5.1)
Sodium: 140 mmol/L (ref 135–145)
Total Bilirubin: 0.3 mg/dL (ref 0.3–1.2)
Total Protein: 7.1 g/dL (ref 6.5–8.1)

## 2022-10-06 LAB — LACTATE DEHYDROGENASE: LDH: 156 U/L (ref 98–192)

## 2022-10-06 LAB — RETICULOCYTES
Immature Retic Fract: 25.1 % — ABNORMAL HIGH (ref 2.3–15.9)
RBC.: 4.49 MIL/uL (ref 3.87–5.11)
Retic Count, Absolute: 101.5 10*3/uL (ref 19.0–186.0)
Retic Ct Pct: 2.3 % (ref 0.4–3.1)

## 2022-10-06 LAB — SAMPLE TO BLOOD BANK

## 2022-10-06 MED ORDER — FOLIC ACID 1 MG PO TABS: 1.0000 mg | ORAL_TABLET | Freq: Every day | ORAL | 11 refills | Status: DC

## 2022-10-06 NOTE — Progress Notes (Signed)
Hematology/Oncology Consultation   Name: Quetcy Vanderheide      MRN: [id number]    Location: Room/bed info not found  Date: 10/06/2022 Time:10:58 AM   REFERRING PHYSICIAN: Marny Lowenstein, NP  REASON FOR CONSULT:  Iron deficiency anemia due to chronic blood loss   DIAGNOSIS: Iron deficiency anemia secondary to heavy cycles   HISTORY OF PRESENT ILLNESS: Ms. Rigano is a very pleasant African American female with iron deficiency anemia secondary to heavy cycles. No other blood loss noted.  No abnormal bruising, no petechiae.  Her iron saturation last week was only 4% and ferritin 4.  She started oral iron 2 weeks ago and is tolerating nicely so far.  Hgb is 9.2, MCV 69, platelets 201 and WBC count 3.6.  She was noted to have an enlarged uterus on exam with gynecology and is scheduled for TVUS on 3/5 to evaluate for fibroids. She states that she tried taking Lysteda but it gave her severe hot flashes and n/v so she stopped.  She is symptomatic with fatigue and is craving ice and cold drinks. She has occasional positional tingling in her fingertips and toes that resolves once she moves.  No known familial history of anemia. No sickle cell disease or trait.  She denies any history of surgery.  No history of diabetes or thyroid disease.  No personal history of cancer. Her maternal grandmother had ovarian, maternal aunt had cervical and maternal uncle had brain.  She has not had to have a colonoscopy yet.  She states that she is over due for her mammogram and will call to schedule.  No fever, chills, n/v, cough, rash, dizziness, SOB, chest pain, palpitations, abdominal pain or changes in bowel or bladder habits.  No swelling or tenderness in her extremities.  No falls or syncope reported.  Appetite and hydration are good. Weight is stable at 193 lbs.  No smoking, ETOH or recreational drug use.  She works in the [employer] during the week and at [employer] on weekends.   ROS:  All other 10 point review of systems is negative.   PAST MEDICAL HISTORY:   Past Medical History:  Diagnosis Date   Diabetes mellitus    gestational-treated with glyburide 2.2m in pm-fbs-80, 2 hour pp 86   Fibroid    Mild or unspecified pre-eclampsia, antepartum 04/08/2012   no meds    ALLERGIES: No Known Allergies    MEDICATIONS:  Current Outpatient Medications on File Prior to Visit  Medication Sig Dispense Refill   ferrous sulfate 325 (65 FE) MG EC tablet Take 1 tablet (325 mg total) by mouth 2 (two) times daily with a meal. 180 tablet 0   guaiFENesin (MUCINEX PO) Take by mouth.     tranexamic acid (LYSTEDA) 650 MG TABS tablet Take 2 tablets (1,300 mg total) by mouth 3 (three) times daily. Take 2 tablets three times per day first 5 days of cycle 30 tablet 1   No current facility-administered medications on file prior to visit.     PAST SURGICAL HISTORY Past Surgical History:  Procedure Laterality Date   CERVICAL CERCLAGE     CESAREAN SECTION  2007, 2001   DILATION AND CURETTAGE OF UTERUS  09/2010    FAMILY HISTORY: Family History  Problem Relation Age of Onset   Hypertension Father    Cancer Maternal Aunt        cervical   Cancer Maternal Uncle        brain   Cancer Maternal Grandmother  Diabetes Maternal Grandfather    Thyroid disease Paternal Grandmother    Hypertension Paternal Grandmother    Stroke Paternal Grandfather    Diabetes Paternal Grandfather     SOCIAL HISTORY:  reports that she has never smoked. She has never used smokeless tobacco. She reports that she does not drink alcohol and does not use drugs.  PERFORMANCE STATUS: The patient's performance status is 1 - Symptomatic but completely ambulatory  PHYSICAL EXAM: Most Recent Vital Signs: Last menstrual period 08/29/2022. BP (!) 141/87 (BP Location: Right Arm, Patient Position: Sitting)   Pulse 75   Temp 99.1 F (37.3 C) (Oral)   Resp 17   Ht 4' 10"$  (1.473 m)   Wt 193 lb (87.5 kg)    LMP 08/29/2022 (Exact Date)   SpO2 100%   BMI 40.34 kg/m   General Appearance:    Alert, cooperative, no distress, appears stated age  Head:    Normocephalic, without obvious abnormality, atraumatic  Eyes:    PERRL, conjunctiva/corneas clear, EOM's intact, fundi    benign, both eyes        Throat:   Lips, mucosa, and tongue normal; teeth and gums normal  Neck:   Supple, symmetrical, trachea midline, no adenopathy;    thyroid:  no enlargement/tenderness/nodules; no carotid   bruit or JVD  Back:     Symmetric, no curvature, ROM normal, no CVA tenderness  Lungs:     Clear to auscultation bilaterally, respirations unlabored  Chest Wall:    No tenderness or deformity   Heart:    Regular rate and rhythm, S1 and S2 normal, no murmur, rub   or gallop     Abdomen:     Soft, non-tender, bowel sounds active all four quadrants,    no masses, no organomegaly        Extremities:   Extremities normal, atraumatic, no cyanosis or edema  Pulses:   2+ and symmetric all extremities  Skin:   Skin color, texture, turgor normal, no rashes or lesions  Lymph nodes:   Cervical, supraclavicular, and axillary nodes normal  Neurologic:   CNII-XII intact, normal strength, sensation and reflexes    throughout    LABORATORY DATA:  Results for orders placed or performed in visit on 10/06/22 (from the past 48 hour(s))  Reticulocytes     Status: Abnormal   Collection Time: 10/06/22 10:41 AM  Result Value Ref Range   Retic Ct Pct 2.3 0.4 - 3.1 %   RBC. 4.49 3.87 - 5.11 MIL/uL   Retic Count, Absolute 101.5 19.0 - 186.0 K/uL   Immature Retic Fract 25.1 (H) 2.3 - 15.9 %    Comment: Performed at [facility], [address], [place], [place] [address]      RADIOGRAPHY: No results found.     PATHOLOGY: None  ASSESSMENT/PLAN: Ms. Wolle is a very pleasant African American female with iron deficiency anemia secondary to heavy cycles.  Patient prefers to continue oral  iron supplement and see if this works prior to trying IV iron.  She will continue taking daily and we will also add Folic acid 1 mg PO daily.  Follow-up in 6 weeks to re-evaluate her response.   All questions were answered. The patient knows to call the clinic with any problems, questions or concerns. We can certainly see the patient much sooner if necessary.   Lottie Dawson, NP

## 2022-10-07 LAB — ERYTHROPOIETIN: Erythropoietin: 50.1 m[IU]/mL — ABNORMAL HIGH (ref 2.6–18.5)

## 2022-10-09 LAB — HGB FRACTIONATION CASCADE
Hgb A2: 1.7 % — ABNORMAL LOW (ref 1.8–3.2)
Hgb A: 95.4 % — ABNORMAL LOW (ref 96.4–98.8)
Hgb F: 2.9 % — ABNORMAL HIGH (ref 0.0–2.0)
Hgb S: 0 %

## 2022-10-17 LAB — ALPHA-THALASSEMIA GENOTYPR

## 2022-10-21 ENCOUNTER — Ambulatory Visit (INDEPENDENT_AMBULATORY_CARE_PROVIDER_SITE_OTHER): Payer: BC Managed Care – PPO | Admitting: Nurse Practitioner

## 2022-10-21 ENCOUNTER — Ambulatory Visit (INDEPENDENT_AMBULATORY_CARE_PROVIDER_SITE_OTHER): Payer: BC Managed Care – PPO

## 2022-10-21 ENCOUNTER — Telehealth: Payer: Self-pay

## 2022-10-21 ENCOUNTER — Encounter: Payer: Self-pay | Admitting: Nurse Practitioner

## 2022-10-21 VITALS — BP 128/80

## 2022-10-21 DIAGNOSIS — N920 Excessive and frequent menstruation with regular cycle: Secondary | ICD-10-CM

## 2022-10-21 DIAGNOSIS — R9389 Abnormal findings on diagnostic imaging of other specified body structures: Secondary | ICD-10-CM

## 2022-10-21 DIAGNOSIS — D500 Iron deficiency anemia secondary to blood loss (chronic): Secondary | ICD-10-CM

## 2022-10-21 DIAGNOSIS — D259 Leiomyoma of uterus, unspecified: Secondary | ICD-10-CM

## 2022-10-21 MED ORDER — NORETHINDRONE 0.35 MG PO TABS: 1.0000 | ORAL_TABLET | Freq: Every day | ORAL | 0 refills | Status: DC

## 2022-10-21 NOTE — Telephone Encounter (Signed)
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 This just means she is prone to making smaller and less red blood cells. The treatment for this is folic acid which she has already started! No sickle cell disease or trait noted! She has a slightly elevated Hgb F which is not significant since she does not have sickle cell disease. Thank you!   ----- Message ----- From: Interface, [facility] In [place] Sent: 10/06/2022  10:53 AM EST To: Celso Amy, NP

## 2022-10-21 NOTE — Progress Notes (Signed)
   Acute Office Visit  Subjective:    Patient ID: Karina Bray, female    DOB: Jun 27, 1976, 47 y.o.   MRN: [id number]   HPI 47 y.o. presents today for ultrasound. Seen as new patient 09/23/2022 with complaints of heavy menses. She has had heavy menses for years but they are getting worse. Cycles occur monthly. First day is light with cramps, heaviest days occur cycle days 2-3 with large clots and requiring changing of large pads + depends every few hours, then tapers off for total bleeding time of 5 days. Discussed management with hormonal contraception, Lysteda, Myfembree/Oriahnn. Provided Lysteda but did not tolerate. Hematology referral placed at that time due to low iron levels. Saw hematology 10/06/2022 with recommendations to continue oral supplementation prior to trying IV iron. BTL.    Review of Systems  Constitutional: Negative.   Genitourinary:  Positive for menstrual problem.       Objective:    Physical Exam Constitutional:      Appearance: Normal appearance.   GU: Not indicated  BP 128/80 (BP Location: Right Arm, Patient Position: Sitting, Cuff Size: Normal)   LMP 08/29/2022 (Exact Date)  Wt Readings from Last 3 Encounters:  10/06/22 193 lb (87.5 kg)  09/23/22 188 lb (85.3 kg)  06/03/12 180 lb 3.2 oz (81.7 kg)        Assessment & Plan:   Problem List Items Addressed This Visit       Other   Iron deficiency anemia due to chronic blood loss   Relevant Medications   norethindrone (ORTHO MICRONOR) 0.35 MG tablet   Other Visit Diagnoses     Menorrhagia with regular cycle    -  Primary   Relevant Medications   norethindrone (ORTHO MICRONOR) 0.35 MG tablet   Abnormal ultrasound of pelvis       Relevant Orders   [place] Sonohysterogram      Vaginal ultrasound: Anteverted/flexed uterus, enlarged with multiple fibroids largest measuring 3.2 cm.  Endometrium 13.5 mm, some vascular areas seen, SHGM may be helpful for further correlation.  Both ovaries mobile, normal  size with normal follicle pattern and normal perfusion.  No adnexal masses, no free fluid.   Plan: Ultrasound thoroughly reviewed with patient. Multiple small fibroids seen, largest 3.2 cm. Some vascularity seen on endometrium with recommendations for [facility]. Did not tolerate Lysteda. Will start POPs for bleeding control. Educated on proper use. All questions answered.      Tamela Gammon DNP, 4:04 PM 10/21/2022

## 2022-11-17 ENCOUNTER — Inpatient Hospital Stay: Payer: BC Managed Care – PPO | Attending: Hematology & Oncology

## 2022-11-17 ENCOUNTER — Inpatient Hospital Stay (HOSPITAL_BASED_OUTPATIENT_CLINIC_OR_DEPARTMENT_OTHER): Payer: BC Managed Care – PPO | Admitting: Family

## 2022-11-17 DIAGNOSIS — D500 Iron deficiency anemia secondary to blood loss (chronic): Secondary | ICD-10-CM | POA: Insufficient documentation

## 2022-11-17 DIAGNOSIS — N920 Excessive and frequent menstruation with regular cycle: Secondary | ICD-10-CM | POA: Diagnosis not present

## 2022-11-17 DIAGNOSIS — D649 Anemia, unspecified: Secondary | ICD-10-CM | POA: Diagnosis not present

## 2022-11-17 LAB — CBC WITH DIFFERENTIAL (CANCER CENTER ONLY)
Abs Immature Granulocytes: 0.02 10*3/uL (ref 0.00–0.07)
Basophils Absolute: 0 10*3/uL (ref 0.0–0.1)
Basophils Relative: 1 %
Eosinophils Absolute: 0.1 10*3/uL (ref 0.0–0.5)
Eosinophils Relative: 2 %
HCT: 32.7 % — ABNORMAL LOW (ref 36.0–46.0)
Hemoglobin: 9.8 g/dL — ABNORMAL LOW (ref 12.0–15.0)
Immature Granulocytes: 1 %
Lymphocytes Relative: 28 %
Lymphs Abs: 1.1 10*3/uL (ref 0.7–4.0)
MCH: 20.9 pg — ABNORMAL LOW (ref 26.0–34.0)
MCHC: 30 g/dL (ref 30.0–36.0)
MCV: 69.7 fL — ABNORMAL LOW (ref 80.0–100.0)
Monocytes Absolute: 0.3 10*3/uL (ref 0.1–1.0)
Monocytes Relative: 9 %
Neutro Abs: 2.3 10*3/uL (ref 1.7–7.7)
Neutrophils Relative %: 59 %
Platelet Count: 204 10*3/uL (ref 150–400)
RBC: 4.69 MIL/uL (ref 3.87–5.11)
RDW: 22 % — ABNORMAL HIGH (ref 11.5–15.5)
WBC Count: 3.8 10*3/uL — ABNORMAL LOW (ref 4.0–10.5)
nRBC: 0 % (ref 0.0–0.2)

## 2022-11-17 LAB — FERRITIN: Ferritin: 8 ng/mL — ABNORMAL LOW (ref 11–307)

## 2022-11-17 LAB — RETICULOCYTES
Immature Retic Fract: 18.5 % — ABNORMAL HIGH (ref 2.3–15.9)
RBC.: 4.61 MIL/uL (ref 3.87–5.11)
Retic Count, Absolute: 56.7 K/uL (ref 19.0–186.0)
Retic Ct Pct: 1.2 % (ref 0.4–3.1)

## 2022-11-17 MED ORDER — FOLIC ACID 1 MG PO TABS: 1.0000 mg | ORAL_TABLET | Freq: Every day | ORAL | 11 refills | Status: AC

## 2022-11-17 NOTE — Progress Notes (Signed)
Hematology and Oncology Follow Up Visit  Karina Bray [id number] 01/29/1976 47 y.o. 11/17/2022   Principle Diagnosis:  Iron deficiency anemia secondary to heavy cycles  Alpha thalassemia minor   Current Therapy:   Oral iron OTC once daily  Folic acid 1 mg PO daily - will start today 11/17/2022   Interim History:  Karina Bray is here today for follow-up. She is doing well and has no complaints at this time.  She has not noted any blood loss. No bruising or petechiae.  Hgb is 9.8, MCV 69, platelets 204 and WBC count 3.8.  She has not yet started her folic acid but will pick up today.  No fever, chills, n/v, cough, rash, dizziness, SOB, chest pain, palpitations, abdominal pain or changes in bowel or bladder habits.  No swelling, tenderness, numbness or tingling in her extremities.  No falls or syncope.  Appetite and hydration are good. Weight is stable at 195 lbs.   ECOG Performance Status: 1 - Symptomatic but completely ambulatory  Medications:  Allergies as of 11/17/2022   No Known Allergies      Medication List        Accurate as of November 17, 2022  2:57 PM. If you have any questions, ask your nurse or doctor.          ferrous sulfate 325 (65 FE) MG EC tablet Take 1 tablet (325 mg total) by mouth 2 (two) times daily with a meal.   folic acid 1 MG tablet Commonly known as: FOLVITE Take 1 tablet (1 mg total) by mouth daily.   MUCINEX PO Take by mouth.   norethindrone 0.35 MG tablet Commonly known as: Ortho Micronor Take 1 tablet (0.35 mg total) by mouth daily.        Allergies: No Known Allergies  Past Medical History, Surgical history, Social history, and Family History were reviewed and updated.  Review of Systems: All other 10 point review of systems is negative.   Physical Exam:  height is 4\' 10"  (1.473 m) and weight is 195 lb (88.5 kg). Her blood pressure is 140/79 (abnormal) and her pulse is 84. Her respiration is 17 and oxygen saturation is 100%.    Wt Readings from Last 3 Encounters:  11/17/22 195 lb (88.5 kg)  10/06/22 193 lb (87.5 kg)  09/23/22 188 lb (85.3 kg)    Ocular: Sclerae unicteric, pupils equal, round and reactive to light Ear-nose-throat: Oropharynx clear, dentition fair Lymphatic: No cervical or supraclavicular adenopathy Lungs no rales or rhonchi, good excursion bilaterally Heart regular rate and rhythm, no murmur appreciated Abd soft, nontender, positive bowel sounds MSK no focal spinal tenderness, no joint edema Neuro: non-focal, well-oriented, appropriate affect Breasts: Deferred   Lab Results  Component Value Date   WBC 3.8 (L) 11/17/2022   HGB 9.8 (L) 11/17/2022   HCT 32.7 (L) 11/17/2022   MCV 69.7 (L) 11/17/2022   PLT 204 11/17/2022   Lab Results  Component Value Date   FERRITIN 4 (L) 09/23/2022   IRON 15 (L) 09/23/2022   TIBC 389 09/23/2022   IRONPCTSAT 4 (L) 09/23/2022   Lab Results  Component Value Date   RETICCTPCT 1.2 11/17/2022   RBC 4.61 11/17/2022   RBC 4.69 11/17/2022   No results found for: "KPAFRELGTCHN", "LAMBDASER", "KAPLAMBRATIO" No results found for: "IGGSERUM", "IGA", "IGMSERUM" No results found for: "TOTALPROTELP", "ALBUMINELP", "A1GS", "A2GS", "BETS", "BETA2SER", "GAMS", "MSPIKE", "SPEI"   Chemistry      Component Value Date/Time   NA 140 10/06/2022 1040  K 3.1 (L) 10/06/2022 1040   CL 103 10/06/2022 1040   CO2 29 10/06/2022 1040   BUN 10 10/06/2022 1040   CREATININE 0.82 10/06/2022 1040   CREATININE 0.58 04/05/2012 1153      Component Value Date/Time   CALCIUM 9.0 10/06/2022 1040   ALKPHOS 73 10/06/2022 1040   AST 15 10/06/2022 1040   ALT 11 10/06/2022 1040   BILITOT 0.3 10/06/2022 1040       Impression and Plan: Karina Bray is a very pleasant African American female with iron deficiency anemia secondary to heavy cycles.  She still prefers to continue her daily oral iron supplement.  She will pick up and start her folic acid today. Prescription resent  today.  Follow-up in 2 months.   Lottie Dawson, NP 4/1/20242:57 PM

## 2022-11-18 LAB — IRON AND IRON BINDING CAPACITY (CC-WL,HP ONLY)
Iron: 28 ug/dL (ref 28–170)
Saturation Ratios: 7 % — ABNORMAL LOW (ref 10.4–31.8)
TIBC: 388 ug/dL (ref 250–450)
UIBC: 360 ug/dL (ref 148–442)

## 2022-12-11 ENCOUNTER — Ambulatory Visit (INDEPENDENT_AMBULATORY_CARE_PROVIDER_SITE_OTHER): Payer: BC Managed Care – PPO | Admitting: Obstetrics and Gynecology

## 2022-12-11 ENCOUNTER — Encounter: Payer: Self-pay | Admitting: Obstetrics and Gynecology

## 2022-12-11 ENCOUNTER — Ambulatory Visit (INDEPENDENT_AMBULATORY_CARE_PROVIDER_SITE_OTHER): Payer: BC Managed Care – PPO

## 2022-12-11 ENCOUNTER — Other Ambulatory Visit (HOSPITAL_COMMUNITY)
Admission: RE | Admit: 2022-12-11 | Discharge: 2022-12-11 | Disposition: A | Discharge status: Home / Self Care | Payer: BC Managed Care – PPO | Source: Ambulatory Visit | Attending: Obstetrics and Gynecology | Admitting: Obstetrics and Gynecology

## 2022-12-11 VITALS — BP 118/70 | HR 62 | Ht <= 58 in | Wt 195.0 lb

## 2022-12-11 DIAGNOSIS — R9389 Abnormal findings on diagnostic imaging of other specified body structures: Secondary | ICD-10-CM | POA: Diagnosis not present

## 2022-12-11 DIAGNOSIS — N920 Excessive and frequent menstruation with regular cycle: Secondary | ICD-10-CM | POA: Insufficient documentation

## 2022-12-11 DIAGNOSIS — Z8632 Personal history of gestational diabetes: Secondary | ICD-10-CM

## 2022-12-11 DIAGNOSIS — Z01812 Encounter for preprocedural laboratory examination: Secondary | ICD-10-CM | POA: Diagnosis not present

## 2022-12-11 DIAGNOSIS — Z1152 Encounter for screening for COVID-19: Secondary | ICD-10-CM | POA: Diagnosis not present

## 2022-12-11 DIAGNOSIS — R7309 Other abnormal glucose: Secondary | ICD-10-CM

## 2022-12-11 HISTORY — DX: Other abnormal glucose: R73.09

## 2022-12-11 LAB — PREGNANCY, URINE: Preg Test, Ur: NEGATIVE

## 2022-12-11 MED ORDER — NORETHIN ACE-ETH ESTRAD-FE 1-20 MG-MCG PO TABS
1.0000 | ORAL_TABLET | Freq: Every day | ORAL | 0 refills | Status: DC
Start: 2022-12-11 — End: 2023-06-16

## 2022-12-11 NOTE — Patient Instructions (Signed)
Oral Contraception Information Oral contraceptive pills (OCPs) are medicines taken by mouth to prevent pregnancy. They work by: Preventing the ovaries from releasing eggs. Thickening mucus in the lower part of the uterus (cervix). This prevents sperm from entering the uterus. Thinning the lining of the uterus (endometrium). This prevents a fertilized egg from attaching to the endometrium. OCPs are highly effective when taken exactly as prescribed. However, OCPs do not prevent STIs (sexually transmitted infections). Using condoms while on an OCP can help prevent STIs. What happens before starting OCPs? Before you start taking OCPs: You may have a physical exam, blood test, and Pap test. Your health care provider will make sure you are a good candidate for oral contraception. OCPs are not a good option for certain women, such as: Women who smoke and are older than age 35. Women who have or have had certain conditions, such as: A history of high blood pressure. Deep vein thrombosis. Pulmonary embolism. Stroke. Cardiovascular disease. Peripheral vascular disease. Ask your health care provider about the possible side effects of the OCP you may be prescribed. Be aware that it can take 2-3 months for your body to adjust to changes in hormone levels. Types of oral contraception  Birth control pills contain the hormones estrogen and progestin (synthetic progesterone) or progestin only. The combination pill This type of pill contains estrogen and progestin hormones. Conventional contraception pills come in packs of 21 or 28 pills. Some packs with 28-day pills contain estrogen and progestin for the first 21-24 days. Hormone-free tablets, called placebos, are taken for the final 4-7 days. You should have menstrual bleeding during the time you take the placebos. In packs with 21 tablets, you take no pills for 7 days. Menstrual bleeding occurs during these days. (Some people prefer taking a pill for 28  days to help establish a routine). Extended-interval contraception pills come in packs of 91 pills. The first 84 tablets have both estrogen and progestin. The last 7 pills are placebos. Menstrual bleeding occurs during the placebo days. With this schedule, menstrual bleeding happens once every 3 months. Continuous contraception pills come in packs of 28 pills. All pills in the pack contain estrogen and progestin. With this schedule, regular menstrual bleeding does not happen, but there may be spotting or irregular bleeding. Progestin-only pills This type of pill is often called the mini-pill and contains the progestin hormone only. It comes in packs of 28 pills. In some packs, the last 4 pills are placebos. The pill must be taken at the same time every day. This is very important to prevent pregnancy. Menstrual bleeding may not be regular or predictable. What are the advantages? Oral contraception provides reliable and continuous contraception if taken as directed. It may treat or decrease symptoms of: Menstrual period cramps. Irregular menstrual cycle or bleeding. Heavy menstrual flow. Abnormal uterine bleeding. Acne, depending on the type of pill. Polycystic ovarian syndrome (POS). Endometriosis. Iron deficiency anemia. Premenstrual symptoms, including severe irritability, depression, or anxiety. It also may: Reduce the risk of endometrial and ovarian cancer. Be used as emergency contraception. Prevent ectopic pregnancies and infections of the fallopian tubes. What can make OCPs less effective? OCPs may be less effective if: You forget to take the pill every day. For progestin-only pills, it is especially important to take the pill at the same time each day. Even taking it 3 hours late can increase the risk of pregnancy. You have a stomach or intestinal disease that reduces your body's ability to absorb the pill.   You take OCPs with other medicines that make OCPs less effective, such as  antibiotics, certain HIV medicines, and some seizure medicines. You take expired OCPs. You forget to restart the pill after 7 days of not taking it. This refers to the packs of 21 pills. What are the side effects and risks? OCPs can sometimes cause side effects, such as: Headache. Depression. Trouble sleeping. Nausea and vomiting. Breast tenderness. Irregular bleeding or spotting during the first several months. Bloating or fluid retention. Increase in blood pressure. Combination pills may slightly increase the risk of: Blood clots. Heart attack. Stroke. Follow these instructions at home: Follow instructions from your health care provider about how to start taking your first cycle of OCPs. Depending on when you start the pill, you may need to use a backup form of birth control, such as condoms, during the first week. Make sure you know what steps to take if you forget to take the pill. Summary Oral contraceptive pills (OCPs) are medicines taken by mouth to prevent pregnancy. They are highly effective when taken exactly as prescribed. OCPs contain a combination of the hormones estrogen and progestin (synthetic progesterone) or progestin only. Before you start taking the pill, you may have a physical exam, blood test, and Pap test. Your health care provider will make sure you are a good candidate for oral contraception. The combination pill may come in a 21-day pack, a 28-day pack, or a 91-day pack. Progestin-only pills come in packs of 28 pills. OCPs can sometimes cause side effects, such as headache, nausea, breast tenderness, or irregular bleeding. This information is not intended to replace advice given to you by your health care provider. Make sure you discuss any questions you have with your health care provider. Document Revised: 05/04/2020 Document Reviewed: 04/12/2020 Elsevier Patient Education  2023 Elsevier Inc.  

## 2022-12-11 NOTE — Progress Notes (Signed)
GYNECOLOGY  VISIT   HPI: 47 y.o.   Married Black or [place] American Not Hispanic or Latino  female   [phone] with Patient's last menstrual period was 11/21/2022.   here for a sonohysterogram. She has menorrhagia leading to anemia. On oral iron. She didn't tolerate lysteda, was given a script for POP, but didn't start them.   U/S 10/21/22: Anteverted/flexed uterus, enlarged with multiple fibroids largest measuring 3.2 cm.  Endometrium 13.5 mm, some vascular areas seen, SHGM may be helpful for further correlation.  Both ovaries mobile, normal size with normal follicle pattern and normal perfusion.  No adnexal masses, no free fluid.   Non smoker, no h/o HT, no migraines, no clotting disorders.   GYNECOLOGIC HISTORY: Patient's last menstrual period was 11/21/2022. Contraception:BTL Menopausal hormone therapy: no        OB History     Gravida  4   Para  3   Term  3   Preterm  0   AB  1   Living  3      SAB  1   IAB      Ectopic      Multiple      Live Births  3              Patient Active Problem List   Diagnosis Date Noted   Iron deficiency anemia due to chronic blood loss 10/06/2022   S/P repeat low transverse C-section 04/22/2012    Past Medical History:  Diagnosis Date   Diabetes mellitus    gestational-treated with glyburide 2.5mg  in pm-fbs-80, 2 hour pp 86   Fibroid    Mild or unspecified pre-eclampsia, antepartum 04/08/2012   no meds    Past Surgical History:  Procedure Laterality Date   CERVICAL CERCLAGE     CESAREAN SECTION  2007, 2001   CESAREAN SECTION WITH BILATERAL TUBAL LIGATION  2013   DILATION AND CURETTAGE OF UTERUS  09/18/2010    Current Outpatient Medications  Medication Sig Dispense Refill   ferrous sulfate 325 (65 FE) MG EC tablet Take 1 tablet (325 mg total) by mouth 2 (two) times daily with a meal. 180 tablet 0   folic acid (FOLVITE) 1 MG tablet Take 1 tablet (1 mg total) by mouth daily. 30 tablet 11   guaiFENesin (MUCINEX  PO) Take by mouth.     No current facility-administered medications for this visit.     ALLERGIES: Patient has no known allergies.  Family History  Problem Relation Age of Onset   Hypertension Father    Cancer Maternal Aunt        cervical   Cancer Maternal Uncle        brain   Cancer Maternal Grandmother    Diabetes Maternal Grandfather    Thyroid disease Paternal Grandmother    Hypertension Paternal Grandmother    Stroke Paternal Grandfather    Diabetes Paternal Grandfather     Social History   Socioeconomic History   Marital status: Married    Spouse name: Not on file   Number of children: Not on file   Years of education: Not on file   Highest education level: Not on file  Occupational History   Not on file  Tobacco Use   Smoking status: Never   Smokeless tobacco: Never  Vaping Use   Vaping Use: Not on file  Substance and Sexual Activity   Alcohol use: No   Drug use: No   Sexual activity: Yes  Partners: Male    Birth control/protection: Surgical    Comment: btl  Other Topics Concern   Not on file  Social History Narrative   Not on file   Social Determinants of Health   Financial Resource Strain: Not on file  Food Insecurity: Not on file  Transportation Needs: Not on file  Physical Activity: Not on file  Stress: Not on file  Social Connections: Not on file  Intimate Partner Violence: Not on file    ROS  PHYSICAL EXAMINATION:    BP 118/70   Pulse 62   Ht  (1.473 m)   Wt 195 lb (88.5 kg)   LMP 11/21/2022   SpO2 100%   BMI 40.76 kg/m     General appearance: alert, cooperative and appears stated age  Pelvic: External genitalia:  no lesions              Urethra:  normal appearing urethra with no masses, tenderness or lesions              Bartholins and Skenes: normal                 Vagina: normal appearing vagina with normal color and discharge, no lesions              Cervix: no lesions               Sonohysterogram &  Endometrial biopsy The procedure and risks of the procedure were reviewed with the patient, consent form was signed. A speculum was placed in the vagina and the cervix was cleansed with betadine. The sonohysterogram/endometrial biopsy catheter was inserted into the uterine cavity without difficulty. Saline was infused under direct observation with the ultrasound. No intracavitary defects were noted.The fluid was withdrawn. The endometrial biopsy was then performed  The uterus sounded to ~10 cm. The endometrial biopsy was performed, taking care to get a representative sample, sampling 360 degrees of the uterine cavity. Moderate tissue was obtained. The catheter was removed. There were no complications.   Chaperone was present for exam.  1. Menorrhagia with regular cycle No intracavitary defects, fibroids do not impinge on the cavity. She didn't tolerate lysteda.  - TSH -We discussed use of NSAID's to lessen bleeding and control her cramps (she has it at home) -Discussed options of OCP's (no contraindications, risks reviewed) and the mirena IUD. Information given. - norethindrone-ethinyl estradiol-FE (LOESTRIN FE) 1-20 MG-MCG tablet; Take 1 tablet by mouth daily.  Dispense: 84 tablet; Refill: 0 -F/U in 3 months  2. History of gestational diabetes Hasn't had her HgbA1C checked in a long time. Random glucose in 2/24 was 140 - Hemoglobin A1c  CC: Karina Beady, NP

## 2022-12-11 NOTE — Addendum Note (Signed)
Addended by: Tobi Bastos on: 12/11/2022 12:34 PM   Modules accepted: Orders

## 2022-12-12 LAB — HEMOGLOBIN A1C
Hgb A1c MFr Bld: 5.7 % of total Hgb — ABNORMAL HIGH (ref <5.7)
Mean Plasma Glucose: 117 mg/dL
eAG (mmol/L): 6.5 mmol/L

## 2022-12-12 LAB — TSH: TSH: 1.15 mIU/L

## 2022-12-15 LAB — SURGICAL PATHOLOGY

## 2022-12-17 ENCOUNTER — Ambulatory Visit: Payer: BC Managed Care – PPO | Admitting: Nurse Practitioner

## 2023-01-19 ENCOUNTER — Inpatient Hospital Stay: Payer: BC Managed Care – PPO | Admitting: Family

## 2023-01-19 ENCOUNTER — Inpatient Hospital Stay: Payer: BC Managed Care – PPO | Attending: Hematology & Oncology

## 2023-01-19 NOTE — Telephone Encounter (Signed)
01/14/2023-LDVM on machine per DPR/tg.   OK to close? Pt to call if referral desired? Also, AEX scheduled w/ TW on 03/04/2023. Can forward to her if desired. Please advise.

## 2023-01-23 ENCOUNTER — Encounter: Payer: Self-pay | Admitting: Obstetrics and Gynecology

## 2023-03-04 ENCOUNTER — Ambulatory Visit: Payer: BC Managed Care – PPO | Admitting: Nurse Practitioner

## 2023-03-04 NOTE — Progress Notes (Deleted)
   Karina Bray 05-18-1976 [id number]   History:  47 y.o. [id number]  presents for annual exam. Seen as new patient in April for menorrhagia causing anemia. Fibroids seen on U/S, largest measuring 3.2 cm, some vascularity seen on endometrium, SHGM normal, EMB negative. Did not tolerate Lysteda. COCs prescribed. On oral iron. Normal pap history. Normal TSH. Pre-diabetic.   Gynecologic History No LMP recorded.   Contraception/Family planning: {method:5051} Sexually active: ***  Health Maintenance Last Pap: 11/27/2021. Results were: Normal Last mammogram: ***. Results were:  Last colonoscopy: Never Last Dexa: Not indicated  Past medical history, past surgical history, family history and social history were all reviewed and documented in the EPIC chart.  ROS:  A ROS was performed and pertinent positives and negatives are included.  Exam:  There were no vitals filed for this visit. There is no height or weight on file to calculate BMI.  General appearance:  Normal Thyroid:  Symmetrical, normal in size, without palpable masses or nodularity. Respiratory  Auscultation:  Clear without wheezing or rhonchi Cardiovascular  Auscultation:  Regular rate, without rubs, murmurs or gallops  Edema/varicosities:  Not grossly evident Abdominal  Soft,nontender, without masses, guarding or rebound.  Liver/spleen:  No organomegaly noted  Hernia:  None appreciated  Skin  Inspection:  Grossly normal Breasts: Examined lying and sitting.   Right: Without masses, retractions, nipple discharge or axillary adenopathy.   Left: Without masses, retractions, nipple discharge or axillary adenopathy. Genitourinary   Inguinal/mons:  Normal without inguinal adenopathy  External genitalia:  Normal appearing vulva with no masses, tenderness, or lesions  BUS/Urethra/Skene's glands:  Normal  Vagina:  Normal appearing with normal color and discharge, no lesions  Cervix:  Normal appearing without discharge or  lesions  Uterus:  Normal in size, shape and contour.  Midline and mobile, nontender  Adnexa/parametria:     Rt: Normal in size, without masses or tenderness.   Lt: Normal in size, without masses or tenderness.  Anus and perineum: Normal  Digital rectal exam: Deferred  Patient informed chaperone available to be present for breast and pelvic exam. Patient has requested no chaperone to be present. Patient has been advised what will be completed during breast and pelvic exam.   Assessment/Plan:  47 y.o. [id number] for annual exam.    Return in 1 year for annual or sooner if needed.   Olivia Mackie DNP, 1:37 PM 03/04/2023

## 2023-06-16 ENCOUNTER — Other Ambulatory Visit: Payer: Self-pay

## 2023-06-16 DIAGNOSIS — N920 Excessive and frequent menstruation with regular cycle: Secondary | ICD-10-CM

## 2023-06-16 MED ORDER — NORETHIN ACE-ETH ESTRAD-FE 1-20 MG-MCG PO TABS: 1.0000 | ORAL_TABLET | Freq: Every day | ORAL | 1 refills | Status: DC

## 2023-06-16 NOTE — Telephone Encounter (Signed)
Medication refill request: Loestrin Fe Last OV:  12/11/22 Next AEX: not scheduled  Last MMG (if hormonal medication request): 06/08/19 Refill authorized: #[id number] rf please advise

## 2023-09-28 DIAGNOSIS — R051 Acute cough: Secondary | ICD-10-CM | POA: Diagnosis not present

## 2023-09-28 DIAGNOSIS — R519 Headache, unspecified: Secondary | ICD-10-CM | POA: Diagnosis not present

## 2023-09-28 DIAGNOSIS — J101 Influenza due to other identified influenza virus with other respiratory manifestations: Secondary | ICD-10-CM | POA: Diagnosis not present

## 2023-09-28 DIAGNOSIS — M791 Myalgia, unspecified site: Secondary | ICD-10-CM | POA: Diagnosis not present

## 2024-01-16 ENCOUNTER — Other Ambulatory Visit: Payer: Self-pay | Admitting: Nurse Practitioner

## 2024-01-16 DIAGNOSIS — N920 Excessive and frequent menstruation with regular cycle: Secondary | ICD-10-CM

## 2024-01-19 NOTE — Telephone Encounter (Signed)
 Medication refill request: loestrin fe  Last AEX:  12/11/22 Next AEX: not scheduled  Last MMG (if hormonal medication request): na  Refill authorized: please advise
